# Patient Record
Sex: Female | Born: 1960 | Race: Black or African American | Hispanic: No | Marital: Single | Smoking: Never smoker
Health system: Southern US, Community
[De-identification: ages and names within clinical notes are randomized; demographics above are authoritative.]

## PROBLEM LIST (undated history)

## (undated) DIAGNOSIS — I639 Cerebral infarction, unspecified: Secondary | ICD-10-CM

## (undated) DIAGNOSIS — F419 Anxiety disorder, unspecified: Secondary | ICD-10-CM

## (undated) DIAGNOSIS — I4891 Unspecified atrial fibrillation: Secondary | ICD-10-CM

## (undated) DIAGNOSIS — I10 Essential (primary) hypertension: Secondary | ICD-10-CM

## (undated) DIAGNOSIS — E119 Type 2 diabetes mellitus without complications: Secondary | ICD-10-CM

---

## 2018-10-08 ENCOUNTER — Other Ambulatory Visit: Payer: Self-pay | Admitting: Family Medicine

## 2018-10-08 DIAGNOSIS — R1312 Dysphagia, oropharyngeal phase: Secondary | ICD-10-CM

## 2018-10-10 ENCOUNTER — Inpatient Hospital Stay
Admission: EM | Admit: 2018-10-10 | Discharge: 2018-10-16 | DRG: 872 | Disposition: A | Discharge status: Skilled Nursing Facility | Payer: Medicare Other | Attending: Specialist | Admitting: Specialist

## 2018-10-10 ENCOUNTER — Other Ambulatory Visit: Payer: Self-pay

## 2018-10-10 ENCOUNTER — Emergency Department: Payer: Medicare Other

## 2018-10-10 DIAGNOSIS — I4891 Unspecified atrial fibrillation: Secondary | ICD-10-CM | POA: Diagnosis present

## 2018-10-10 DIAGNOSIS — F329 Major depressive disorder, single episode, unspecified: Secondary | ICD-10-CM | POA: Diagnosis present

## 2018-10-10 DIAGNOSIS — A419 Sepsis, unspecified organism: Secondary | ICD-10-CM | POA: Diagnosis not present

## 2018-10-10 DIAGNOSIS — Z794 Long term (current) use of insulin: Secondary | ICD-10-CM

## 2018-10-10 DIAGNOSIS — E785 Hyperlipidemia, unspecified: Secondary | ICD-10-CM | POA: Diagnosis present

## 2018-10-10 DIAGNOSIS — Z79899 Other long term (current) drug therapy: Secondary | ICD-10-CM

## 2018-10-10 DIAGNOSIS — N390 Urinary tract infection, site not specified: Secondary | ICD-10-CM

## 2018-10-10 DIAGNOSIS — Z7982 Long term (current) use of aspirin: Secondary | ICD-10-CM

## 2018-10-10 DIAGNOSIS — I10 Essential (primary) hypertension: Secondary | ICD-10-CM | POA: Diagnosis present

## 2018-10-10 DIAGNOSIS — I259 Chronic ischemic heart disease, unspecified: Secondary | ICD-10-CM | POA: Diagnosis present

## 2018-10-10 DIAGNOSIS — G4733 Obstructive sleep apnea (adult) (pediatric): Secondary | ICD-10-CM | POA: Diagnosis present

## 2018-10-10 DIAGNOSIS — F419 Anxiety disorder, unspecified: Secondary | ICD-10-CM | POA: Diagnosis present

## 2018-10-10 DIAGNOSIS — R7989 Other specified abnormal findings of blood chemistry: Secondary | ICD-10-CM | POA: Diagnosis not present

## 2018-10-10 DIAGNOSIS — I69320 Aphasia following cerebral infarction: Secondary | ICD-10-CM

## 2018-10-10 DIAGNOSIS — R778 Other specified abnormalities of plasma proteins: Secondary | ICD-10-CM

## 2018-10-10 DIAGNOSIS — Z7901 Long term (current) use of anticoagulants: Secondary | ICD-10-CM

## 2018-10-10 DIAGNOSIS — E119 Type 2 diabetes mellitus without complications: Secondary | ICD-10-CM | POA: Diagnosis present

## 2018-10-10 DIAGNOSIS — T783XXA Angioneurotic edema, initial encounter: Secondary | ICD-10-CM

## 2018-10-10 HISTORY — DX: Unspecified atrial fibrillation: I48.91

## 2018-10-10 HISTORY — DX: Essential (primary) hypertension: I10

## 2018-10-10 HISTORY — DX: Anxiety disorder, unspecified: F41.9

## 2018-10-10 HISTORY — DX: Type 2 diabetes mellitus without complications: E11.9

## 2018-10-10 HISTORY — DX: Cerebral infarction, unspecified: I63.9

## 2018-10-10 MED ORDER — DIPHENHYDRAMINE HCL 50 MG/ML IJ SOLN
INTRAMUSCULAR | Status: AC
  Filled 2018-10-10: qty 1

## 2018-10-10 MED ORDER — METHYLPREDNISOLONE SODIUM SUCC 125 MG IJ SOLR
INTRAMUSCULAR | Status: AC
  Filled 2018-10-10: qty 2

## 2018-10-10 MED ORDER — FAMOTIDINE IN NACL 20-0.9 MG/50ML-% IV SOLN
20.0000 mg | Freq: Once | INTRAVENOUS | Status: AC
  Administered 2018-10-10: 20 mg via INTRAVENOUS
  Filled 2018-10-10: qty 50

## 2018-10-10 MED ORDER — DIPHENHYDRAMINE HCL 50 MG/ML IJ SOLN
25.0000 mg | Freq: Once | INTRAMUSCULAR | Status: AC
  Administered 2018-10-10: 25 mg via INTRAVENOUS

## 2018-10-10 MED ORDER — METHYLPREDNISOLONE SODIUM SUCC 125 MG IJ SOLR
125.0000 mg | Freq: Once | INTRAMUSCULAR | Status: AC
  Administered 2018-10-10: 125 mg via INTRAVENOUS

## 2018-10-10 MED ORDER — SODIUM CHLORIDE 0.9 % IV BOLUS
500.0000 mL | Freq: Once | INTRAVENOUS | Status: AC
  Administered 2018-10-10: 500 mL via INTRAVENOUS

## 2018-10-10 NOTE — ED Triage Notes (Signed)
Pt from [facility]. Pt arrives with multiple family members as well who state pt with lower lip swelling since 1100. Family states "I know you're going to say lisinopril is a possiblity, but that's not it". Pt's family states they are not happy with care being received at [facility]. Pt's family also states that pt with left hand swelling for two days. 

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
## 2018-10-11 ENCOUNTER — Emergency Department: Payer: Medicare Other

## 2018-10-11 ENCOUNTER — Encounter: Payer: Self-pay | Admitting: Internal Medicine

## 2018-10-11 ENCOUNTER — Other Ambulatory Visit: Payer: Self-pay

## 2018-10-11 DIAGNOSIS — I4891 Unspecified atrial fibrillation: Secondary | ICD-10-CM | POA: Diagnosis present

## 2018-10-11 DIAGNOSIS — T783XXA Angioneurotic edema, initial encounter: Secondary | ICD-10-CM | POA: Diagnosis present

## 2018-10-11 DIAGNOSIS — Z7901 Long term (current) use of anticoagulants: Secondary | ICD-10-CM | POA: Diagnosis not present

## 2018-10-11 DIAGNOSIS — R7989 Other specified abnormal findings of blood chemistry: Secondary | ICD-10-CM | POA: Diagnosis present

## 2018-10-11 DIAGNOSIS — N390 Urinary tract infection, site not specified: Secondary | ICD-10-CM | POA: Diagnosis present

## 2018-10-11 DIAGNOSIS — A419 Sepsis, unspecified organism: Secondary | ICD-10-CM | POA: Diagnosis present

## 2018-10-11 DIAGNOSIS — E119 Type 2 diabetes mellitus without complications: Secondary | ICD-10-CM | POA: Diagnosis present

## 2018-10-11 DIAGNOSIS — F419 Anxiety disorder, unspecified: Secondary | ICD-10-CM | POA: Diagnosis present

## 2018-10-11 DIAGNOSIS — E785 Hyperlipidemia, unspecified: Secondary | ICD-10-CM | POA: Diagnosis present

## 2018-10-11 DIAGNOSIS — Z794 Long term (current) use of insulin: Secondary | ICD-10-CM | POA: Diagnosis not present

## 2018-10-11 DIAGNOSIS — Z79899 Other long term (current) drug therapy: Secondary | ICD-10-CM | POA: Diagnosis not present

## 2018-10-11 DIAGNOSIS — Z7982 Long term (current) use of aspirin: Secondary | ICD-10-CM | POA: Diagnosis not present

## 2018-10-11 DIAGNOSIS — I69320 Aphasia following cerebral infarction: Secondary | ICD-10-CM | POA: Diagnosis not present

## 2018-10-11 DIAGNOSIS — F329 Major depressive disorder, single episode, unspecified: Secondary | ICD-10-CM | POA: Diagnosis present

## 2018-10-11 DIAGNOSIS — I10 Essential (primary) hypertension: Secondary | ICD-10-CM | POA: Diagnosis present

## 2018-10-11 DIAGNOSIS — I259 Chronic ischemic heart disease, unspecified: Secondary | ICD-10-CM | POA: Diagnosis present

## 2018-10-11 DIAGNOSIS — G4733 Obstructive sleep apnea (adult) (pediatric): Secondary | ICD-10-CM | POA: Diagnosis present

## 2018-10-11 LAB — CBC WITH DIFFERENTIAL/PLATELET
Abs Immature Granulocytes: 0.01 10*3/uL (ref 0.00–0.07)
Basophils Absolute: 0 10*3/uL (ref 0.0–0.1)
Basophils Relative: 0 %
Eosinophils Absolute: 0.1 10*3/uL (ref 0.0–0.5)
Eosinophils Relative: 1 %
HEMATOCRIT: 37.2 % (ref 36.0–46.0)
Hemoglobin: 11.4 g/dL — ABNORMAL LOW (ref 12.0–15.0)
Immature Granulocytes: 0 %
LYMPHS ABS: 1.6 10*3/uL (ref 0.7–4.0)
Lymphocytes Relative: 25 %
MCH: 26.6 pg (ref 26.0–34.0)
MCHC: 30.6 g/dL (ref 30.0–36.0)
MCV: 86.9 fL (ref 80.0–100.0)
Monocytes Absolute: 0.7 10*3/uL (ref 0.1–1.0)
Monocytes Relative: 11 %
Neutro Abs: 3.9 10*3/uL (ref 1.7–7.7)
Neutrophils Relative %: 63 %
Platelets: 381 10*3/uL (ref 150–400)
RBC: 4.28 MIL/uL (ref 3.87–5.11)
RDW: 14.6 % (ref 11.5–15.5)
WBC: 6.3 10*3/uL (ref 4.0–10.5)
nRBC: 0 % (ref 0.0–0.2)

## 2018-10-11 LAB — COMPREHENSIVE METABOLIC PANEL
ALT: 23 U/L (ref 0–44)
AST: 25 U/L (ref 15–41)
Albumin: 3.5 g/dL (ref 3.5–5.0)
Alkaline Phosphatase: 57 U/L (ref 38–126)
Anion gap: 12 (ref 5–15)
BUN: 27 mg/dL — ABNORMAL HIGH (ref 6–20)
CO2: 27 mmol/L (ref 22–32)
Calcium: 9.5 mg/dL (ref 8.9–10.3)
Chloride: 99 mmol/L (ref 98–111)
Creatinine, Ser: 1.07 mg/dL — ABNORMAL HIGH (ref 0.44–1.00)
GFR calc Af Amer: >60 mL/min (ref >60)
GFR calc non Af Amer: 57 mL/min — ABNORMAL LOW (ref >60)
Glucose, Bld: 267 mg/dL — ABNORMAL HIGH (ref 70–99)
Potassium: 3.8 mmol/L (ref 3.5–5.1)
Sodium: 138 mmol/L (ref 135–145)
Total Bilirubin: 0.9 mg/dL (ref 0.3–1.2)
Total Protein: 7.6 g/dL (ref 6.5–8.1)

## 2018-10-11 LAB — HEMOGLOBIN A1C
Hgb A1c MFr Bld: 8.6 % — ABNORMAL HIGH (ref 4.8–5.6)
Mean Plasma Glucose: 200.12 mg/dL

## 2018-10-11 LAB — URINALYSIS, COMPLETE (UACMP) WITH MICROSCOPIC
Bilirubin Urine: NEGATIVE
Glucose, UA: >=500 mg/dL — AB
Hgb urine dipstick: NEGATIVE
Ketones, ur: 20 mg/dL — AB
Leukocytes, UA: NEGATIVE
Nitrite: NEGATIVE
PROTEIN: 30 mg/dL — AB
Specific Gravity, Urine: 1.018 (ref 1.005–1.030)
pH: 7 (ref 5.0–8.0)

## 2018-10-11 LAB — MRSA PCR SCREENING: MRSA by PCR: NEGATIVE

## 2018-10-11 LAB — LACTIC ACID, PLASMA: Lactic Acid, Venous: 1.1 mmol/L (ref 0.5–1.9)

## 2018-10-11 LAB — GLUCOSE, CAPILLARY
Glucose-Capillary: 306 mg/dL — ABNORMAL HIGH (ref 70–99)
Glucose-Capillary: 303 mg/dL — ABNORMAL HIGH (ref 70–99)
Glucose-Capillary: 261 mg/dL — ABNORMAL HIGH (ref 70–99)
Glucose-Capillary: 239 mg/dL — ABNORMAL HIGH (ref 70–99)

## 2018-10-11 LAB — CG4 I-STAT (LACTIC ACID): Lactic Acid, Venous: 2.55 mmol/L (ref 0.5–1.9)

## 2018-10-11 LAB — TROPONIN I: Troponin I: 0.04 ng/mL (ref <0.03)

## 2018-10-11 LAB — TSH: TSH: 0.793 u[IU]/mL (ref 0.350–4.500)

## 2018-10-11 MED ORDER — SACCHAROMYCES BOULARDII 250 MG PO CAPS
250.0000 mg | ORAL_CAPSULE | Freq: Every day | ORAL | Status: DC
  Administered 2018-10-11 – 2018-10-16 (×6): 250 mg
  Filled 2018-10-11 (×6): qty 1

## 2018-10-11 MED ORDER — ASPIRIN 81 MG PO CHEW
81.0000 mg | CHEWABLE_TABLET | Freq: Every day | ORAL | Status: DC
  Administered 2018-10-11 – 2018-10-16 (×6): 81 mg
  Filled 2018-10-11 (×6): qty 1

## 2018-10-11 MED ORDER — SODIUM CHLORIDE 0.9 % IV BOLUS (SEPSIS)
1000.0000 mL | Freq: Once | INTRAVENOUS | Status: AC
  Administered 2018-10-11: 1000 mL via INTRAVENOUS

## 2018-10-11 MED ORDER — ACETAMINOPHEN 160 MG/5ML PO SOLN
650.0000 mg | ORAL | Status: DC | PRN
  Administered 2018-10-11 – 2018-10-16 (×6): 650 mg
  Filled 2018-10-11 (×8): qty 20.3

## 2018-10-11 MED ORDER — FERROUS SULFATE 220 (44 FE) MG/5ML PO ELIX
300.0000 mg | ORAL_SOLUTION | Freq: Every day | ORAL | Status: DC
  Administered 2018-10-11 – 2018-10-16 (×6): 300 mg via ORAL
  Filled 2018-10-11 (×7): qty 6.9

## 2018-10-11 MED ORDER — LISINOPRIL 20 MG PO TABS
20.0000 mg | ORAL_TABLET | Freq: Every day | ORAL | Status: DC
  Administered 2018-10-11 – 2018-10-13 (×3): 20 mg
  Filled 2018-10-11 (×3): qty 1

## 2018-10-11 MED ORDER — INSULIN GLARGINE 100 UNIT/ML ~~LOC~~ SOLN
24.0000 [IU] | Freq: Every day | SUBCUTANEOUS | Status: DC
  Administered 2018-10-11 – 2018-10-15 (×5): 24 [IU] via SUBCUTANEOUS
  Filled 2018-10-11 (×6): qty 0.24

## 2018-10-11 MED ORDER — FREE WATER
180.0000 mL | Freq: Three times a day (TID) | Status: DC
  Administered 2018-10-11 – 2018-10-16 (×20): 180 mL

## 2018-10-11 MED ORDER — VITAMIN B-12 1000 MCG PO TABS
1000.0000 ug | ORAL_TABLET | Freq: Every day | ORAL | Status: DC
  Administered 2018-10-11 – 2018-10-16 (×6): 1000 ug
  Filled 2018-10-11 (×6): qty 1

## 2018-10-11 MED ORDER — SODIUM CHLORIDE 0.9 % IV SOLN
INTRAVENOUS | Status: DC
  Administered 2018-10-11 – 2018-10-14 (×3): via INTRAVENOUS

## 2018-10-11 MED ORDER — INSULIN ASPART 100 UNIT/ML ~~LOC~~ SOLN
0.0000 [IU] | Freq: Three times a day (TID) | SUBCUTANEOUS | Status: DC
  Administered 2018-10-11: 12:00:00 100 [IU] via SUBCUTANEOUS
  Administered 2018-10-11: 17:00:00 5 [IU] via SUBCUTANEOUS
  Administered 2018-10-11: 08:00:00 7 [IU] via SUBCUTANEOUS
  Administered 2018-10-12: 2 [IU] via SUBCUTANEOUS
  Administered 2018-10-12: 3 [IU] via SUBCUTANEOUS
  Filled 2018-10-11 (×5): qty 1

## 2018-10-11 MED ORDER — ALPRAZOLAM 0.25 MG PO TABS
0.2500 mg | ORAL_TABLET | Freq: Once | ORAL | Status: AC
  Administered 2018-10-11: 0.25 mg
  Filled 2018-10-11: qty 1

## 2018-10-11 MED ORDER — EPINEPHRINE 0.3 MG/0.3ML IJ SOAJ
0.3000 mg | Freq: Once | INTRAMUSCULAR | Status: AC
  Administered 2018-10-11: 0.3 mg via INTRAMUSCULAR
  Filled 2018-10-11: qty 0.3

## 2018-10-11 MED ORDER — ORAL CARE MOUTH RINSE
15.0000 mL | Freq: Two times a day (BID) | OROMUCOSAL | Status: DC
  Administered 2018-10-11 – 2018-10-16 (×11): 15 mL via OROMUCOSAL

## 2018-10-11 MED ORDER — FERROUS SULFATE 300 (60 FE) MG/5ML PO SYRP
300.0000 mg | ORAL_SOLUTION | Freq: Every day | ORAL | Status: DC
  Filled 2018-10-11: qty 5

## 2018-10-11 MED ORDER — SODIUM CHLORIDE 0.9 % IV BOLUS (SEPSIS)
500.0000 mL | Freq: Once | INTRAVENOUS | Status: AC
  Administered 2018-10-11: 05:00:00 500 mL via INTRAVENOUS

## 2018-10-11 MED ORDER — MELATONIN 5 MG PO TABS
5.0000 mg | ORAL_TABLET | Freq: Every day | ORAL | Status: DC
  Administered 2018-10-11 – 2018-10-15 (×5): 5 mg
  Filled 2018-10-11 (×6): qty 1

## 2018-10-11 MED ORDER — ATROPINE SULFATE 1 % OP SOLN
1.0000 [drp] | Freq: Three times a day (TID) | OPHTHALMIC | Status: DC | PRN
  Filled 2018-10-11: qty 2

## 2018-10-11 MED ORDER — SODIUM CHLORIDE 0.9 % IV SOLN
2.0000 g | Freq: Once | INTRAVENOUS | Status: AC
  Administered 2018-10-11: 2 g via INTRAVENOUS
  Filled 2018-10-11: qty 2

## 2018-10-11 MED ORDER — SODIUM CHLORIDE 0.9 % IV SOLN
1.0000 g | Freq: Two times a day (BID) | INTRAVENOUS | Status: DC
  Administered 2018-10-11: 1 g via INTRAVENOUS
  Filled 2018-10-11 (×2): qty 1

## 2018-10-11 MED ORDER — LISINOPRIL 20 MG PO TABS: 20.0000 mg | ORAL_TABLET | Freq: Every day | ORAL | Status: DC

## 2018-10-11 MED ORDER — FLUOXETINE HCL 20 MG/5ML PO SOLN
40.0000 mg | Freq: Every day | ORAL | Status: DC
  Administered 2018-10-11 – 2018-10-16 (×6): 40 mg
  Filled 2018-10-11 (×7): qty 10

## 2018-10-11 MED ORDER — ADULT MULTIVITAMIN W/MINERALS CH
1.0000 | ORAL_TABLET | Freq: Every day | ORAL | Status: DC
  Administered 2018-10-11 – 2018-10-16 (×6): 1
  Filled 2018-10-11 (×6): qty 1

## 2018-10-11 MED ORDER — GLUCERNA 1.5 CAL PO LIQD
237.0000 mL | Freq: Three times a day (TID) | ORAL | Status: DC
  Administered 2018-10-11 – 2018-10-16 (×20): 237 mL

## 2018-10-11 MED ORDER — SODIUM CHLORIDE 0.9 % IV SOLN
1.0000 g | INTRAVENOUS | Status: DC
  Administered 2018-10-11 – 2018-10-12 (×2): 1 g via INTRAVENOUS
  Filled 2018-10-11: qty 10
  Filled 2018-10-11: qty 1
  Filled 2018-10-11: qty 10

## 2018-10-11 MED ORDER — VITAMIN C 500 MG PO TABS
250.0000 mg | ORAL_TABLET | Freq: Two times a day (BID) | ORAL | Status: DC
  Administered 2018-10-11 – 2018-10-16 (×11): 250 mg
  Filled 2018-10-11 (×11): qty 1

## 2018-10-11 MED ORDER — DILTIAZEM HCL 30 MG PO TABS
60.0000 mg | ORAL_TABLET | Freq: Three times a day (TID) | ORAL | Status: DC
  Administered 2018-10-11 – 2018-10-16 (×15): 60 mg
  Filled 2018-10-11 (×15): qty 2

## 2018-10-11 MED ORDER — HYDRALAZINE HCL 25 MG PO TABS
25.0000 mg | ORAL_TABLET | Freq: Four times a day (QID) | ORAL | Status: DC | PRN
  Administered 2018-10-13 – 2018-10-14 (×3): 25 mg
  Filled 2018-10-11 (×4): qty 1

## 2018-10-11 MED ORDER — DOCUSATE SODIUM 100 MG PO CAPS
100.0000 mg | ORAL_CAPSULE | Freq: Two times a day (BID) | ORAL | Status: DC
  Administered 2018-10-11 – 2018-10-15 (×6): 100 mg via ORAL
  Filled 2018-10-11 (×8): qty 1

## 2018-10-11 MED ORDER — BENZOCAINE 10 % MT GEL
1.0000 "application " | Freq: Three times a day (TID) | OROMUCOSAL | Status: DC | PRN
  Administered 2018-10-11 – 2018-10-14 (×4): 1 via OROMUCOSAL
  Filled 2018-10-11: qty 9

## 2018-10-11 MED ORDER — FREE WATER: 180.0000 mL | Freq: Three times a day (TID) | Status: DC

## 2018-10-11 MED ORDER — PRO-STAT SUGAR FREE PO LIQD
30.0000 mL | Freq: Every day | ORAL | Status: DC
  Administered 2018-10-11 – 2018-10-16 (×6): 30 mL

## 2018-10-11 MED ORDER — ONDANSETRON HCL 4 MG/2ML IJ SOLN
4.0000 mg | Freq: Four times a day (QID) | INTRAMUSCULAR | Status: DC | PRN
  Administered 2018-10-13: 4 mg via INTRAVENOUS
  Filled 2018-10-11: qty 2

## 2018-10-11 MED ORDER — ONDANSETRON HCL 4 MG PO TABS: 4.0000 mg | ORAL_TABLET | Freq: Four times a day (QID) | ORAL | Status: DC | PRN

## 2018-10-11 MED ORDER — GLUCERNA 1.5 CAL PO LIQD: 237.0000 mL | Freq: Three times a day (TID) | ORAL | Status: DC

## 2018-10-11 MED ORDER — FLUTICASONE PROPIONATE 50 MCG/ACT NA SUSP
2.0000 | Freq: Two times a day (BID) | NASAL | Status: DC
  Administered 2018-10-11 – 2018-10-16 (×11): 2 via NASAL
  Filled 2018-10-11: qty 16

## 2018-10-11 MED ORDER — GLUCAGON HCL RDNA (DIAGNOSTIC) 1 MG IJ SOLR: 1.0000 mg | Freq: Every day | INTRAMUSCULAR | Status: DC | PRN

## 2018-10-11 MED ORDER — APIXABAN 5 MG PO TABS
5.0000 mg | ORAL_TABLET | Freq: Two times a day (BID) | ORAL | Status: DC
  Administered 2018-10-11 – 2018-10-16 (×11): 5 mg
  Filled 2018-10-11 (×11): qty 1

## 2018-10-11 MED ORDER — ATROPINE SULFATE 1 % OP SOLN
1.0000 [drp] | Freq: Three times a day (TID) | OPHTHALMIC | Status: DC | PRN
  Administered 2018-10-11 – 2018-10-16 (×7): 1 [drp] via SUBLINGUAL
  Filled 2018-10-11 (×2): qty 2

## 2018-10-11 MED ORDER — POLYETHYLENE GLYCOL 3350 17 G PO PACK
17.0000 g | PACK | Freq: Every day | ORAL | Status: DC | PRN
  Administered 2018-10-12: 17 g
  Filled 2018-10-11: qty 1

## 2018-10-11 MED ORDER — ATORVASTATIN CALCIUM 80 MG PO TABS
80.0000 mg | ORAL_TABLET | Freq: Every day | ORAL | Status: DC
  Administered 2018-10-11 – 2018-10-15 (×5): 80 mg
  Filled 2018-10-11: qty 1
  Filled 2018-10-11 (×2): qty 4
  Filled 2018-10-11 (×5): qty 1

## 2018-10-11 NOTE — H&P (Signed)
Summer Welch is an 57 y.o. female.   Chief Complaint: Oral swelling HPI: The patient with past medical history of stroke presents to the emergency department from [facility] due to mouth swelling.  Patient's family reports that the swelling began approximately 12 hours prior to arrival.  It is unclear what the patient may be allergic to.  She has been taking lisinopril but the family is adamant that on previous occasions the lip swelling was not deemed to be secondary to ACE inhibitor's.  The patient is nonverbal but indicates that she is not in pain.  She also denies shortness of breath (via head movements yes or no questions).  She was given epinephrine for her angioedema.  She was also noted to eat sepsis criteria and have a strong odor of urine which prompted the emergency department staff to obtain blood and urine cultures prior to starting antibiotics and fluid resuscitation.  The patient remained stable throughout the ED evaluation and was eventually admitted to the hospitalist service.  Past Medical History:  Diagnosis Date  . Anxiety   . Stroke [facility])     History reviewed. No pertinent surgical history.  History reviewed. No pertinent family history. Social History:  reports that she has never smoked. She has never used smokeless tobacco. She reports that she does not drink alcohol or use drugs.  Allergies: No Known Allergies  Medications Prior to Admission  Medication Sig Dispense Refill  . Amino Acids-Protein Hydrolys (FEEDING SUPPLEMENT, PRO-STAT SUGAR FREE 64,) LIQD Place 30 mLs into feeding tube daily.    Marland Kitchen apixaban (ELIQUIS) 5 MG TABS tablet Place 5 mg into feeding tube 2 (two) times daily.    Marland Kitchen aspirin 81 MG chewable tablet Place 81 mg into feeding tube daily.    Marland Kitchen atorvastatin (LIPITOR) 80 MG tablet Place 80 mg into feeding tube daily at 6 PM.    . diltiazem (CARDIZEM) 60 MG tablet Place 60 mg into feeding tube 3 (three) times daily.    . ferrous sulfate 300 (60 Fe)  MG/5ML syrup Place 300 mg into feeding tube daily.    Marland Kitchen FLUoxetine (PROZAC) 20 MG/5ML solution Place 40 mg into feeding tube daily.    . fluticasone (FLONASE) 50 MCG/ACT nasal spray Place 2 sprays into both nostrils every 12 (twelve) hours.    . insulin glargine (LANTUS) 100 UNIT/ML injection Inject 40 Units into the skin daily.    . insulin lispro (HUMALOG) 100 UNIT/ML injection Inject 0-10 Units into the skin every 6 (six) hours. If blood sugar is less than 60, call MD 201-250= 2 units 251-300= 4 units 301-350= 6 units 351-400= 8 units 401-450= 10 units If blood sugar is greater than 450, call MD.    . insulin lispro (HUMALOG) 100 UNIT/ML injection Inject 13 Units into the skin every 6 (six) hours.    Marland Kitchen lisinopril (PRINIVIL,ZESTRIL) 20 MG tablet Place 20 mg into feeding tube daily.    . Melatonin 5 MG TABS Place 5 mg into feeding tube at bedtime.    . Multiple Vitamin (MULTIVITAMIN WITH MINERALS) TABS tablet Place 1 tablet into feeding tube daily.    Marland Kitchen saccharomyces boulardii (FLORASTOR) 250 MG capsule Place 250 mg into feeding tube daily.    . vitamin B-12 (CYANOCOBALAMIN) 1000 MCG tablet Place 1,000 mcg into feeding tube daily.    . vitamin C (ASCORBIC ACID) 250 MG tablet Place 250 mg into feeding tube 2 (two) times daily.    Marland Kitchen acetaminophen (TYLENOL) 160 MG/5ML solution Place  650 mg into feeding tube every 4 (four) hours as needed (pain).    Marland Kitchen atropine 1 % ophthalmic solution Place 1 drop into both eyes 3 (three) times daily as needed (secretions).    . BENZOCAINE, DENTAL, (ANBESOL MAXIMUM STRENGTH) 20 % LIQD 1 application by Mouth Rinse route 3 (three) times daily as needed (pain). Apply to teeth    . glucagon (GLUCAGEN) 1 MG SOLR injection Inject 1 mg into the muscle daily as needed for low blood sugar.    . hydrALAZINE (APRESOLINE) 25 MG tablet Place 25 mg into feeding tube every 6 (six) hours as needed (SBP >165 or DBP >95).    . ondansetron (ZOFRAN) 4 MG tablet Place 4 mg into  feeding tube 4 (four) times daily as needed for nausea or vomiting.    . polyethylene glycol (MIRALAX / GLYCOLAX) packet Place 17 g into feeding tube daily as needed for mild constipation.      Results for orders placed or performed during the hospital encounter of 10/10/18 (from the past 48 hour(s))  CBC with Differential     Status: Abnormal   Collection Time: 10/10/18 11:39 PM  Result Value Ref Range   WBC 6.3 4.0 - 10.5 K/uL   RBC 4.28 3.87 - 5.11 MIL/uL   Hemoglobin 11.4 (L) 12.0 - 15.0 g/dL   HCT 16.1 09.6 - 04.5 %   MCV 86.9 80.0 - 100.0 fL   MCH 26.6 26.0 - 34.0 pg   MCHC 30.6 30.0 - 36.0 g/dL   RDW 40.9 81.1 - 91.4 %   Platelets 381 150 - 400 K/uL   nRBC 0.0 0.0 - 0.2 %   Neutrophils Relative % 63 %   Neutro Abs 3.9 1.7 - 7.7 K/uL   Lymphocytes Relative 25 %   Lymphs Abs 1.6 0.7 - 4.0 K/uL   Monocytes Relative 11 %   Monocytes Absolute 0.7 0.1 - 1.0 K/uL   Eosinophils Relative 1 %   Eosinophils Absolute 0.1 0.0 - 0.5 K/uL   Basophils Relative 0 %   Basophils Absolute 0.0 0.0 - 0.1 K/uL   Immature Granulocytes 0 %   Abs Immature Granulocytes 0.01 0.00 - 0.07 K/uL    Comment: Performed at [facility], [address] [place], [place] [address]  Comprehensive metabolic panel     Status: Abnormal   Collection Time: 10/10/18 11:39 PM  Result Value Ref Range   Sodium 138 135 - 145 mmol/L   Potassium 3.8 3.5 - 5.1 mmol/L   Chloride 99 98 - 111 mmol/L   CO2 27 22 - 32 mmol/L   Glucose, Bld 267 (H) 70 - 99 mg/dL   BUN 27 (H) 6 - 20 mg/dL   Creatinine, Ser 6.21 (H) 0.44 - 1.00 mg/dL   Calcium 9.5 8.9 - 30.8 mg/dL   Total Protein 7.6 6.5 - 8.1 g/dL   Albumin 3.5 3.5 - 5.0 g/dL   AST 25 15 - 41 U/L   ALT 23 0 - 44 U/L   Alkaline Phosphatase 57 38 - 126 U/L   Total Bilirubin 0.9 0.3 - 1.2 mg/dL   GFR calc non Af Amer 57 (L) >60 mL/min   GFR calc Af Amer >60 >60 mL/min   Anion gap 12 5 - 15    Comment: Performed at [facility], [address],  [place], [place] [address]  Troponin I - Once     Status: Abnormal   Collection Time: 10/10/18 11:39 PM  Result Value  Ref Range   Troponin I 0.04 (HH) <0.03 ng/mL    Comment: CRITICAL RESULT CALLED TO, READ BACK BY AND VERIFIED WITH APRIL BRUMGARD ON 10/11/18 AT 0036 [facility] Performed at [facility], [address] [place], [place] [address]   Urinalysis, Complete w Microscopic     Status: Abnormal   Collection Time: 10/10/18 11:39 PM  Result Value Ref Range   Color, Urine AMBER (A) YELLOW    Comment: BIOCHEMICALS MAY BE AFFECTED BY COLOR   APPearance CLOUDY (A) CLEAR   Specific Gravity, Urine 1.018 1.005 - 1.030   pH 7.0 5.0 - 8.0   Glucose, UA >=500 (A) NEGATIVE mg/dL   Hgb urine dipstick NEGATIVE NEGATIVE   Bilirubin Urine NEGATIVE NEGATIVE   Ketones, ur 20 (A) NEGATIVE mg/dL   Protein, ur 30 (A) NEGATIVE mg/dL   Nitrite NEGATIVE NEGATIVE   Leukocytes, UA NEGATIVE NEGATIVE   RBC / HPF 11-20 0 - 5 RBC/hpf   WBC, UA 0-5 0 - 5 WBC/hpf   Bacteria, UA MANY (A) NONE SEEN   Squamous Epithelial / LPF 0-5 0 - 5   Amorphous Crystal PRESENT     Comment: Performed at [facility], [address] [place], [place] [address]  TSH     Status: None   Collection Time: 10/10/18 11:39 PM  Result Value Ref Range   TSH 0.793 0.350 - 4.500 uIU/mL    Comment: Performed by a 3rd Generation assay with a functional sensitivity of <=0.01 uIU/mL. Performed at [facility], [address] [place], [place] [address]   CG4 I-STAT (Lactic acid)     Status: Abnormal   Collection Time: 10/11/18 12:40 AM  Result Value Ref Range   Lactic Acid, Venous 2.55 (HH) 0.5 - 1.9 mmol/L   Comment NOTIFIED PHYSICIAN   Blood Culture (routine x 2)     Status: None (Preliminary result)   Collection Time: 10/11/18  2:12 AM  Result Value Ref Range   Specimen Description BLOOD RIGHT FOREARM    Special Requests      BOTTLES DRAWN AEROBIC AND ANAEROBIC Blood Culture results may not be optimal due  to an inadequate volume of blood received in culture bottles   Culture      NO GROWTH < 12 HOURS Performed at [facility], [address] [place], [place] [address]    Report Status PENDING   Blood Culture (routine x 2)     Status: None (Preliminary result)   Collection Time: 10/11/18  2:12 AM  Result Value Ref Range   Specimen Description BLOOD RIGHT HAND    Special Requests      BOTTLES DRAWN AEROBIC AND ANAEROBIC Blood Culture results may not be optimal due to an inadequate volume of blood received in culture bottles   Culture      NO GROWTH < 12 HOURS Performed at [facility], [address] [place], [place] [address]    Report Status PENDING   MRSA PCR Screening     Status: None   Collection Time: 10/11/18  5:14 AM  Result Value Ref Range   MRSA by PCR NEGATIVE NEGATIVE    Comment:        The GeneXpert MRSA Assay (FDA approved for NASAL specimens only), is one component of a comprehensive MRSA colonization surveillance program. It is not intended to diagnose MRSA infection nor to guide or monitor treatment for MRSA infections. Performed at [facility], [address] [place], [place] [address]  Lactic acid, plasma     Status: None   Collection Time: 10/11/18  6:19 AM  Result Value Ref Range   Lactic Acid, Venous 1.1 0.5 - 1.9 mmol/L    Comment: Performed at [facility], [address] [place], [place] [address]   Dg Chest Port 1 View  Result Date: 10/10/2018 CLINICAL DATA:  Lower lip swelling EXAM: PORTABLE CHEST 1 VIEW COMPARISON:  None. FINDINGS: The heart size and mediastinal contours are within normal limits. Both lungs are clear. The visualized skeletal structures are unremarkable. IMPRESSION: No active cardiopulmonary disease. Electronically Signed   By: Sherian ReinWei-Chen  Lin M.D.   On: 10/10/2018 23:52   Dg Hand Complete Left  Result Date: 10/11/2018 CLINICAL DATA:  Left hand swelling EXAM: LEFT HAND - COMPLETE 3+  VIEW COMPARISON:  None. FINDINGS: There is no evidence of fracture or dislocation. There is no evidence of arthropathy or other focal bone abnormality. Soft tissues are unremarkable. IMPRESSION: No acute osseous abnormality of the left hand. Electronically Signed   By: Deatra RobinsonKevin  Herman M.D.   On: 10/11/2018 01:42    Review of Systems  Unable to perform ROS: Patient nonverbal    Blood pressure (!) 155/79, pulse (!) 102, temperature 98.3 F (36.8 C), temperature source Oral, resp. rate 20, height 5\' 9"  (1.753 m), weight 82.6 kg, SpO2 97 %. Physical Exam  Vitals reviewed. Constitutional: She is oriented to person, place, and time. She appears well-developed and well-nourished. No distress.  HENT:  Head: Normocephalic and atraumatic.  Mouth/Throat: Oropharynx is clear and moist.    Eyes: Pupils are equal, round, and reactive to light. Conjunctivae and EOM are normal. No scleral icterus.  Neck: Normal range of motion. Neck supple. No JVD present. No tracheal deviation present. No thyromegaly present.  Cardiovascular: Normal rate, regular rhythm and normal heart sounds. Exam reveals no gallop and no friction rub.  No murmur heard. Respiratory: Effort normal and breath sounds normal.  GI: Soft. Bowel sounds are normal. She exhibits no distension. There is no abdominal tenderness.  Musculoskeletal:        General: Edema (Left upper extremity) present.  Lymphadenopathy:    She has no cervical adenopathy.  Neurological: She is alert and oriented to person, place, and time. No cranial nerve deficit. She exhibits normal muscle tone.  Left side hemiparesis  Skin: Skin is warm and dry.  Psychiatric:  Difficult to assess as the patient is not verbal     Assessment/Plan This is a 57 year old female admitted for sepsis. 1.  Sepsis: The patient meets criteria via tachycardia and tachypnea.  She is hemodynamically stable.  Urine is likely source.  Continue cefepime.  Follow blood and urine cultures for  growth and sensitivities. 2.  UTI: Present on admission.  Antibiotics as above until sensitivities allow narrowing of spectrum. 3.  Angioedema: Improving; monitor respiratory status.  I have discontinued lisinopril. 4.  Diabetes mellitus type 2: Continue basal insulin therapy as well as sliding scale insulin while hospitalized. 5.  History of stroke: Presumably secondary to A. fib.  Continue Eliquis and diltiazem.  Continue aspirin.  Secondary prevention with statin therapy, blood sugar management and blood pressure control. 6.  DVT prophylaxis: Therapeutic anticoagulation 7.  GI prophylaxis: None The patient is a full code.  Time spent on admission orders and patient care approximately 45 minutes  Arnaldo Nataliamond,  Michael S, MD 10/11/2018, 7:13 AM

## 2018-10-11 NOTE — Progress Notes (Signed)
Initial Nutrition Assessment  DOCUMENTATION CODES:   Not applicable  INTERVENTION:  Initiate Glucerna 1.5 Cal bolus 1 can QID per G-tube. Also provide Pro-Stat 30 mL once daily per G-tube. Provides 1524 kcal, 93 grams of protein, 15.2 grams of fiber, 720 mL H2O daily.  Flush tube with 60 mL before each can of tube feeding and 120 mL after each can of tube feeding. Flush tube with 60 mL prior to providing Pro-Stat, mix Pro-Stat with 60 mL of water before giving per tube, and flush with another 60 mL water after Pro-Stat.  Continue MVI daily per tube, vitamin B12 1000 micrograms daily per tube, and vitamin C 250 mg BID per tube.  NUTRITION DIAGNOSIS:   Inadequate oral intake related to inability to eat, dysphagia as evidenced by NPO status(reliance on G-tube to meet 100% calorie/protein/hydration needs).  GOAL:   Patient will meet greater than or equal to 90% of their needs  MONITOR:   Labs, Weight trends, TF tolerance, Skin, I & O's  REASON FOR ASSESSMENT:   Malnutrition Screening Tool, Consult Assessment of nutrition requirement/status, Enteral/tube feeding initiation and management  ASSESSMENT:   57 year old female with PMHx of anxiety, hx CVA, DM type 2 who is admitted from [facility] with angioedema, sepsis, UTI.   Met with patient at bedside. She is nonverbal and no family members present at time of RD assessment. There was no paperwork sent from [facility] with patient so was unable to see her usual tube feed orders. Called and spoke with RN that cares for patient at [facility]. She reports patient was getting "too much" tube feeding so it was recently decreased. She is now on Glucerna 1.5 Cal one can bolus QID (6am, noon, 6pm, midnight) with a free water flush of 300 mL after each bolus regimen. She is unsure if she is still getting the Pro-Stat 30 mL daily per tube. Abdomen is soft. Patient has an 18 Fr. G-tube in place (no records available on when this was  placed). Wasting in lower extremities found on NFPE likely related to muscle atrophy from non-ambulatory status.  No access to any weight history. Patient is currently 82.6 kg (182 lbs) if current weight in chart is accurate.  Medications reviewed and include: Eliquis, Colace 100 mg BID, Pro-Stat 30 mL daily, ferrous sulfate 300 mg daily, Novolog 0-9 units TID, Lantus 24 units QHS, MVI daily per tube, Florastor 250 mg daily per tube, vitamin B12 1000 mcg daily per tube, vitamin C 240 mg BID per tube, cefepime.  Labs reviewed: CBG 306. On 12/28, BUN 27, Creatinine 1.07.  Patient does not meet criteria for malnutrition.  Discussed with RN.  NUTRITION - FOCUSED PHYSICAL EXAM:    Most Recent Value  Orbital Region  No depletion  Upper Arm Region  No depletion  Thoracic and Lumbar Region  No depletion  Buccal Region  No depletion  Temple Region  No depletion  Clavicle Bone Region  No depletion  Clavicle and Acromion Bone Region  No depletion  Scapular Bone Region  No depletion  Dorsal Hand  No depletion  Patellar Region  Moderate depletion  Anterior Thigh Region  Moderate depletion  Posterior Calf Region  Severe depletion  Edema (RD Assessment)  None  Hair  Reviewed  Eyes  Reviewed  Mouth  Unable to assess  Skin  Reviewed  Nails  Reviewed     Diet Order:   Diet Order    None     EDUCATION NEEDS:  No education needs have been identified at this time  Skin:  Skin Assessment: Reviewed RN Assessment(scattered ecchymosis)  Last BM:  10/11/2018 per chart  Height:   Ht Readings from Last 1 Encounters:  10/10/18 '5\' 9"'$  (1.753 m)   Weight:   Wt Readings from Last 1 Encounters:  10/10/18 82.6 kg   Ideal Body Weight:  65.9 kg  BMI:  Body mass index is 26.88 kg/m.  Estimated Nutritional Needs:   Kcal:  5072-2575 (MSJ x 1.1-1.3)  Protein:  80-100 grams (1-1.2 grams/kg)  Fluid:  1.6-1.9 L/day (25-30 mL/kg IBW)  Willey Blade, MS, RD, LDN Office:  [phone] Pager: [phone] After Hours/Weekend Pager: [phone]

## 2018-10-11 NOTE — ED Notes (Signed)
Pt repositioned in bed for comfort.

## 2018-10-11 NOTE — Consult Note (Signed)
HPI: Patient was admitted to the [facility] last night secondary to lip swelling. Family brought patient to ED [facility] and acute change in lip. They had been with her the night before and she looked fine. They came in the next morning (Sat AM) and noted some bleeding from corner of mouth and lip swelling. They then came to [place] Saturday night; they desire a change in facility. There is no history of this happening before. They deny respiratory difficulty, stridor. There is associated left wrist swelling and discomfort, per nursing work-up of wrist swelling has been negative. Subjective history is very difficult to obtain from patient as she is aphasic 2/t stroke.  Past Medical History:  Diagnosis Date  . Anxiety   . Stroke [facility](HCC)    No current facility-administered medications on file prior to encounter.    Current Outpatient Medications on File Prior to Encounter  Medication Sig Dispense Refill  . Amino Acids-Protein Hydrolys (FEEDING SUPPLEMENT, PRO-STAT SUGAR FREE 64,) LIQD Place 30 mLs into feeding tube daily.    Marland Kitchen. apixaban (ELIQUIS) 5 MG TABS tablet Place 5 mg into feeding tube 2 (two) times daily.    Marland Kitchen. aspirin 81 MG chewable tablet Place 81 mg into feeding tube daily.    Marland Kitchen. atorvastatin (LIPITOR) 80 MG tablet Place 80 mg into feeding tube daily at 6 PM.    . diltiazem (CARDIZEM) 60 MG tablet Place 60 mg into feeding tube 3 (three) times daily.    . ferrous sulfate 300 (60 Fe) MG/5ML syrup Place 300 mg into feeding tube daily.    Marland Kitchen. FLUoxetine (PROZAC) 20 MG/5ML solution Place 40 mg into feeding tube daily.    . fluticasone (FLONASE) 50 MCG/ACT nasal spray Place 2 sprays into both nostrils every 12 (twelve) hours.    . insulin glargine (LANTUS) 100 UNIT/ML injection Inject 40 Units into the skin daily.    . insulin lispro (HUMALOG) 100 UNIT/ML injection Inject 0-10 Units into the skin every 6 (six) hours. If blood sugar is less than 60, call MD 201-250= 2  units 251-300= 4 units 301-350= 6 units 351-400= 8 units 401-450= 10 units If blood sugar is greater than 450, call MD.    . insulin lispro (HUMALOG) 100 UNIT/ML injection Inject 13 Units into the skin every 6 (six) hours.    Marland Kitchen. lisinopril (PRINIVIL,ZESTRIL) 20 MG tablet Place 20 mg into feeding tube daily.    . Melatonin 5 MG TABS Place 5 mg into feeding tube at bedtime.    . Multiple Vitamin (MULTIVITAMIN WITH MINERALS) TABS tablet Place 1 tablet into feeding tube daily.    Marland Kitchen. saccharomyces boulardii (FLORASTOR) 250 MG capsule Place 250 mg into feeding tube daily.    . vitamin B-12 (CYANOCOBALAMIN) 1000 MCG tablet Place 1,000 mcg into feeding tube daily.    . vitamin C (ASCORBIC ACID) 250 MG tablet Place 250 mg into feeding tube 2 (two) times daily.    Marland Kitchen. acetaminophen (TYLENOL) 160 MG/5ML solution Place 650 mg into feeding tube every 4 (four) hours as needed (pain).    Marland Kitchen. atropine 1 % ophthalmic solution Place 1 drop into both eyes 3 (three) times daily as needed (secretions).    . BENZOCAINE, DENTAL, (ANBESOL MAXIMUM STRENGTH) 20 % LIQD 1 application by Mouth Rinse route 3 (three) times daily as needed (pain). Apply to teeth    . glucagon (GLUCAGEN) 1 MG SOLR injection Inject 1 mg into the muscle daily as needed for low blood sugar.    .Marland Kitchen  hydrALAZINE (APRESOLINE) 25 MG tablet Place 25 mg into feeding tube every 6 (six) hours as needed (SBP >165 or DBP >95).    . ondansetron (ZOFRAN) 4 MG tablet Place 4 mg into feeding tube 4 (four) times daily as needed for nausea or vomiting.    . polyethylene glycol (MIRALAX / GLYCOLAX) packet Place 17 g into feeding tube daily as needed for mild constipation.    No Known Allergies   History reviewed. No pertinent family history.   There is no recent lip surgery.  Today's Vitals   10/11/18 0315 10/11/18 0421 10/11/18 0955 10/11/18 0958  BP:  (!) 155/79 (!) 117/101 (!) 155/64  Pulse: (!) 111 (!) 102 (!) 113 (!) 107  Resp:  20 20 20   Temp:  98.3 F  (36.8 C) 99.3 F (37.4 C)   TempSrc:  Oral Oral   SpO2: 99% 97% 96% 95%  Weight:      Height:       Body mass index is 26.88 kg/m.   WBC- 6  Exam Gen- NAD Resp- NWOB, no stridor, no wheezing OC/OP- Swelling of left lower lip with some echymoses along vermilion border. The lip mucosa has overlying white exudate, but no purulence. It is slightly indurated, but I do not appreciate any discrete underlying mass. Teeth are in poor condition but no obvious dental trauma. Tongue, palate, buccal mucosa, and posterior oropharyx wnl Neuro-  Facial function grossly intact and symmetric Skin- no cutaneous lesions on face Neck- no lymphadenopathy or masses, parotid/thyroid normal to palpation  A/P- 57 yo with acute onset lip swelling and mucosal change occurring Friday night. I discussed differential including infectious, neoplastic, and traumatic etiologies with patient and sister. I favor traumatic cause given exam findings/appearance of mucosa, timing of events, and bleeding noted by family coincident with onset of swelling. I do not feel this warrants biopsy at present time; this is also complicated by fact that patient is anticoagulated.  I recommend conservative measures including oral/dental hygiene and ice packs as needed. This is not compromising her airway at this time, exam findings are not consistent with angioedema. I would expect improvement in next 48-72 hours, if lesion/mucosal change begins to expand/worsen I'd be happy to re-evaluate.

## 2018-10-11 NOTE — ED Notes (Signed)
Transporting patient to room 110.

## 2018-10-11 NOTE — ED Notes (Signed)
Explanation of admission plan provided to pt and family.

## 2018-10-11 NOTE — Progress Notes (Signed)
Low grade fever. Noted large ulcerated like area on inner left lower lip with large amount edema which is tender to touch. Oral care done. ENT consult done and relates to probable trauma. IV antibiotics continued with IVF's. Gtube feedings resumed and tolerated well. Urine is malodorous. Family in at bedside.

## 2018-10-11 NOTE — Progress Notes (Signed)
Chaplain responded to an OR for an AD. Pt is not cognizant to understand AD.    10/11/18 0900  Clinical Encounter Type  Visited With Patient  Visit Type Initial  Referral From Nurse  Spiritual Encounters  Spiritual Needs Brochure

## 2018-10-11 NOTE — ED Notes (Signed)
Explanation of treatment plan provided to pt and family.

## 2018-10-11 NOTE — Progress Notes (Signed)
Pharmacy Antibiotic Note  [name] is a 57 y.o. female admitted on 10/10/2018 with UTI.  Pharmacy has been consulted for Cefepime dosing.  Plan: Cefepime 1g IV q12h  Height: 5\' 9"  (175.3 cm) Weight: 182 lb (82.6 kg) IBW/kg (Calculated) : 66.2  Temp (24hrs), Avg:98.2 F (36.8 C), Min:98.2 F (36.8 C), Max:98.2 F (36.8 C)  Recent Labs  Lab 10/10/18 2339 10/11/18 0040  WBC 6.3  --   CREATININE 1.07*  --   LATICACIDVEN  --  2.55*    Estimated Creatinine Clearance: 65.9 mL/min (A) (by C-G formula based on SCr of 1.07 mg/dL (H)).    No Known Allergies  Antimicrobials this admission: Cefepime 12/29 >>   Microbiology results: 12/29 BCx: pending 12/29 UCx: pending   Thank you for allowing pharmacy to be a part of this patient's care.  Clovia CuffLisa Conna Terada, PharmD, BCPS 10/11/2018 3:28 AM

## 2018-10-11 NOTE — Progress Notes (Signed)
CODE SEPSIS - PHARMACY COMMUNICATION  **Broad Spectrum Antibiotics should be administered within 1 hour of Sepsis diagnosis**  Time Code Sepsis Called/Page Received: 01:01  Antibiotics Ordered: Cefepime  Time of 1st antibiotic administration: 01:56  Additional action taken by pharmacy: called RN 01:50  If necessary, Name of Provider/Nurse Contacted:      Foye DeerLisa G Byrdie Miyazaki ,PharmD Clinical Pharmacist  10/11/2018  2:01 AM

## 2018-10-11 NOTE — ED Notes (Signed)
No change noted to lip. Pt cleansed of bowel movement, suction provided for clear secretions in mouth. Head of bed elevated to 45 degrees, legs repositioned with pillows and bed for comfort.

## 2018-10-11 NOTE — ED Notes (Signed)
In to check on pt; pt rubbing right thigh; unable to verbalize needs but shakes head when asked if having pain; assisted pt with positioning pillow under leg for comfort; has relaxed now; IV site to right hand unremarkable; fluids infusing without difficulty; clear secretions suctioned from inside cheeks; pt tolerated well

## 2018-10-11 NOTE — ED Notes (Signed)
Note both sets of blood cultures collected at this time, prior to antibiotic administration.

## 2018-10-11 NOTE — ED Notes (Signed)
Critical troponin of 0.04 called from lab. Dr. Sung notified.  

## 2018-10-11 NOTE — Progress Notes (Signed)
[place] at [place] NAME: Summer Welch    MR#:  [id number]  DATE OF BIRTH:  Apr 09, 1961  SUBJECTIVE:   Patient admitted to the hospital this morning due to lip swelling and suspected sepsis and possible angioedema.  Patient denies any shortness of breath.  She is nonverbal and aphasic due to her underlying stroke.  REVIEW OF SYSTEMS:    Review of Systems  Unable to perform ROS: Mental acuity    Nutrition: Tube feedings Tolerating Diet: yes Tolerating PT: Await Eval.   DRUG ALLERGIES:  No Known Allergies  VITALS:  Blood pressure (!) 155/64, pulse (!) 107, temperature 99.3 F (37.4 C), temperature source Oral, resp. rate 20, height _0  (1.753 m), weight 82.6 kg, SpO2 95 %.  PHYSICAL EXAMINATION:   Physical Exam  GENERAL:  57 y.o.-year-old patient lying in bed in no acute distress.  EYES: Pupils equal, round, reactive to light and accommodation. No scleral icterus. Extraocular muscles intact.  HEENT: Head atraumatic, normocephalic. Oropharynx and nasopharynx clear.  Lower lip lesion as shown below   NECK:  Supple, no jugular venous distention. No thyroid enlargement, no tenderness LUNGS: Normal breath sounds bilaterally, no wheezing, rales, rhonchi. No use of accessory muscles of respiration.  CARDIOVASCULAR: S1, S2 normal. No murmurs, rubs, or gallops.  ABDOMEN: Soft, nontender, nondistended. Bowel sounds present. No organomegaly or mass.  EXTREMITIES: No cyanosis, clubbing or edema b/l.    NEUROLOGIC: Cranial nerves II through XII are intact. No focal Motor or sensory deficits b/l. Globally weak and aphasic.    PSYCHIATRIC: The patient is alert and oriented x 3.  SKIN: No obvious rash, lesion, or ulcer.    LABORATORY PANEL:   CBC Recent Labs  Lab 10/10/18 2339  WBC 6.3  HGB 11.4*  HCT 37.2  PLT 381    ------------------------------------------------------------------------------------------------------------------  Chemistries  Recent Labs  Lab 10/10/18 2339  NA 138  K 3.8  CL 99  CO2 27  GLUCOSE 267*  BUN 27*  CREATININE 1.07*  CALCIUM 9.5  AST 25  ALT 23  ALKPHOS 57  BILITOT 0.9   ------------------------------------------------------------------------------------------------------------------  Cardiac Enzymes Recent Labs  Lab 10/10/18 2339  TROPONINI 0.04*   ------------------------------------------------------------------------------------------------------------------  RADIOLOGY:  Dg Chest Port 1 View  Result Date: 10/10/2018 CLINICAL DATA:  Lower lip swelling EXAM: PORTABLE CHEST 1 VIEW COMPARISON:  None. FINDINGS: The heart size and mediastinal contours are within normal limits. Both lungs are clear. The visualized skeletal structures are unremarkable. IMPRESSION: No active cardiopulmonary disease. Electronically Signed   By: Abelardo Diesel M.D.   On: 10/10/2018 23:52   Dg Hand Complete Left  Result Date: 10/11/2018 CLINICAL DATA:  Left hand swelling EXAM: LEFT HAND - COMPLETE 3+ VIEW COMPARISON:  None. FINDINGS: There is no evidence of fracture or dislocation. There is no evidence of arthropathy or other focal bone abnormality. Soft tissues are unremarkable. IMPRESSION: No acute osseous abnormality of the left hand. Electronically Signed   By: Ulyses Jarred M.D.   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  1.  Suspected sepsis- patient met criteria on admission given her tachycardia, tachypnea and urinalysis consistent with a UTI. - Continue IV ceftriaxone, follow cultures.  Patient is currently afebrile and  hemodynamically  stable.  2.  Urinary tract infection- in the ER patient given broad-spectrum IV antibiotics but will narrow to just IV ceftriaxone for now.  Follow urine cultures.  3.  Lower lip swelling/angioedema- this was suspected on admission.  I do not think the patient has angioedema secondary to ACE inhibitors.  She has localized left lip swelling.  No airway compromise or hemodynamic instability. - We will get ENT to further evaluate the lower lip lesion.  4.  Diabetes type 2 without complication- continue sliding scale insulin, follow blood sugars.    5.  History of atrial fibrillation-rate controlled.  Continue Cardizem. -Continue Eliquis.  6.  History of previous CVA with resulting aphasia- continue Eliquis.  7.  Essential hypertension-continue Cardizem, will resume lisinopril.  8.  Hyperlipidemia-continue atorvastatin.  69.  Nutrition-given patient's previous CVA she is currently on tube feeds.  Dietary consulted and will resume tube feedings as per them.  10.  Depression-continue Prozac.   All the records are reviewed and case discussed with Care Management/Social Worker. Management plans discussed with the patient, family and they are in agreement.  CODE STATUS: Full code  DVT Prophylaxis: Eliquis  TOTAL TIME TAKING CARE OF THIS PATIENT: 30 minutes.   POSSIBLE D/C IN 2-3 DAYS, DEPENDING ON CLINICAL CONDITION.   Henreitta Leber M.D on 10/11/2018 at 1:47 PM  Between 7am to 6pm - Pager - [phone]  After 6pm go to [email or web address] - [facility]  Office  [phone]  CC: Primary care physician; Center, [facility]

## 2018-10-12 LAB — BASIC METABOLIC PANEL
Anion gap: 6 (ref 5–15)
BUN: 30 mg/dL — ABNORMAL HIGH (ref 6–20)
CALCIUM: 8.7 mg/dL — AB (ref 8.9–10.3)
CO2: 25 mmol/L (ref 22–32)
Chloride: 109 mmol/L (ref 98–111)
Creatinine, Ser: 0.96 mg/dL (ref 0.44–1.00)
GFR calc Af Amer: >60 mL/min (ref >60)
GFR calc non Af Amer: >60 mL/min (ref >60)
Glucose, Bld: 238 mg/dL — ABNORMAL HIGH (ref 70–99)
Potassium: 4 mmol/L (ref 3.5–5.1)
Sodium: 140 mmol/L (ref 135–145)

## 2018-10-12 LAB — GLUCOSE, CAPILLARY
Glucose-Capillary: 181 mg/dL — ABNORMAL HIGH (ref 70–99)
Glucose-Capillary: 202 mg/dL — ABNORMAL HIGH (ref 70–99)
Glucose-Capillary: 127 mg/dL — ABNORMAL HIGH (ref 70–99)
Glucose-Capillary: 149 mg/dL — ABNORMAL HIGH (ref 70–99)

## 2018-10-12 LAB — CBC
HCT: 30.7 % — ABNORMAL LOW (ref 36.0–46.0)
Hemoglobin: 9.3 g/dL — ABNORMAL LOW (ref 12.0–15.0)
MCH: 26.6 pg (ref 26.0–34.0)
MCHC: 30.3 g/dL (ref 30.0–36.0)
MCV: 88 fL (ref 80.0–100.0)
Platelets: 305 10*3/uL (ref 150–400)
RBC: 3.49 MIL/uL — ABNORMAL LOW (ref 3.87–5.11)
RDW: 14.8 % (ref 11.5–15.5)
WBC: 12.4 10*3/uL — AB (ref 4.0–10.5)
nRBC: 0 % (ref 0.0–0.2)

## 2018-10-12 MED ORDER — INSULIN ASPART 100 UNIT/ML ~~LOC~~ SOLN
0.0000 [IU] | Freq: Every day | SUBCUTANEOUS | Status: DC
  Administered 2018-10-13: 23:00:00 2 [IU] via SUBCUTANEOUS
  Filled 2018-10-12: qty 1

## 2018-10-12 MED ORDER — INSULIN ASPART 100 UNIT/ML ~~LOC~~ SOLN
0.0000 [IU] | Freq: Three times a day (TID) | SUBCUTANEOUS | Status: DC
  Administered 2018-10-13: 2 [IU] via SUBCUTANEOUS
  Administered 2018-10-13: 5 [IU] via SUBCUTANEOUS
  Administered 2018-10-13: 2 [IU] via SUBCUTANEOUS
  Administered 2018-10-14: 3 [IU] via SUBCUTANEOUS
  Administered 2018-10-14: 5 [IU] via SUBCUTANEOUS
  Administered 2018-10-14 – 2018-10-15 (×2): 3 [IU] via SUBCUTANEOUS
  Administered 2018-10-15: 2 [IU] via SUBCUTANEOUS
  Administered 2018-10-15: 3 [IU] via SUBCUTANEOUS
  Administered 2018-10-16: 08:00:00 2 [IU] via SUBCUTANEOUS
  Administered 2018-10-16: 12:00:00 3 [IU] via SUBCUTANEOUS
  Filled 2018-10-12 (×11): qty 1

## 2018-10-12 MED ORDER — INSULIN ASPART 100 UNIT/ML ~~LOC~~ SOLN
4.0000 [IU] | Freq: Four times a day (QID) | SUBCUTANEOUS | Status: DC
  Administered 2018-10-12 – 2018-10-16 (×16): 4 [IU] via SUBCUTANEOUS
  Filled 2018-10-12 (×16): qty 1

## 2018-10-12 NOTE — Progress Notes (Signed)
[place] at [place] NAME: Summer Welch    MR#:  [id number]  DATE OF BIRTH:  06-Jan-1961  SUBJECTIVE:   No acute events overnight, swelling of her left side of her lower lip has improved.  Seen by ENT and they do not think this is angioedema but likely traumatic ulcer with some swelling.  REVIEW OF SYSTEMS:    Review of Systems  Unable to perform ROS: Mental acuity    Nutrition: Tube feedings Tolerating Diet: yes Tolerating PT: bedbound at baseline   DRUG ALLERGIES:  No Known Allergies  VITALS:  Blood pressure 102/65, pulse 97, temperature 97.6 F (36.4 C), temperature source Oral, resp. rate 20, height _0  (1.753 m), weight 89.1 kg, SpO2 95 %.  PHYSICAL EXAMINATION:   Physical Exam  GENERAL:  57 y.o.-year-old patient lying in bed in no acute distress.  EYES: Pupils equal, round, reactive to light and accommodation. No scleral icterus. Extraocular muscles intact.  HEENT: Head atraumatic, normocephalic. Oropharynx and nasopharynx clear.  Lower lip lesion as shown below   NECK:  Supple, no jugular venous distention. No thyroid enlargement, no tenderness LUNGS: Normal breath sounds bilaterally, no wheezing, rales, rhonchi. No use of accessory muscles of respiration.  CARDIOVASCULAR: S1, S2 normal. No murmurs, rubs, or gallops.  ABDOMEN: Soft, nontender, nondistended. Bowel sounds present. No organomegaly or mass.  EXTREMITIES: No cyanosis, clubbing or edema b/l.    NEUROLOGIC: Cranial nerves II through XII are intact. No focal Motor or sensory deficits b/l. Globally weak and aphasic.    PSYCHIATRIC: The patient is alert and oriented x 1  SKIN: No obvious rash, lesion, or ulcer.    LABORATORY PANEL:   CBC Recent Labs  Lab 10/12/18 0419  WBC 12.4*  HGB 9.3*  HCT 30.7*  PLT 305   ------------------------------------------------------------------------------------------------------------------  Chemistries  Recent  Labs  Lab 10/10/18 2339 10/12/18 0419  NA 138 140  K 3.8 4.0  CL 99 109  CO2 27 25  GLUCOSE 267* 238*  BUN 27* 30*  CREATININE 1.07* 0.96  CALCIUM 9.5 8.7*  AST 25  --   ALT 23  --   ALKPHOS 57  --   BILITOT 0.9  --    ------------------------------------------------------------------------------------------------------------------  Cardiac Enzymes Recent Labs  Lab 10/10/18 2339  TROPONINI 0.04*   ------------------------------------------------------------------------------------------------------------------  RADIOLOGY:  Dg Chest Port 1 View  Result Date: 10/10/2018 CLINICAL DATA:  Lower lip swelling EXAM: PORTABLE CHEST 1 VIEW COMPARISON:  None. FINDINGS: The heart size and mediastinal contours are within normal limits. Both lungs are clear. The visualized skeletal structures are unremarkable. IMPRESSION: No active cardiopulmonary disease. Electronically Signed   By: Abelardo Diesel M.D.   On: 10/10/2018 23:52   Dg Hand Complete Left  Result Date: 10/11/2018 CLINICAL DATA:  Left hand swelling EXAM: LEFT HAND - COMPLETE 3+ VIEW COMPARISON:  None. FINDINGS: There is no evidence of fracture or dislocation. There is no evidence of arthropathy or other focal bone abnormality. Soft tissues are unremarkable. IMPRESSION: No acute osseous abnormality of the left hand. Electronically Signed   By: Ulyses Jarred M.D.   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  1.  Suspected sepsis- patient met criteria on admission given her  tachycardia, tachypnea and urinalysis consistent with a UTI. - Continue IV ceftriaxone, and cultures are positive for Proteus but sensitivities pending.  Afebrile and hemodynamically stable.  2.  Urinary tract infection-  continue IV ceftriaxone, urine cultures positive for Proteus but sensitivities pending.  3.  Lower lip swelling- unlikely this is angioedema as patient swelling is localized to the left lateral lip.  Did get a ENT consult yesterday who do not think this is angioedema but likely traumatic ulcer with some swelling.  Continue local care as per ENT. -Swelling looks better today.  4.  Diabetes type 2 without complication- patient had diabetes coordinator input.  Patient's blood sugars are somewhat labile.  Continue Lantus, will continue sliding scale insulin with bedtime coverage, also add some NovoLog 4 times daily with the patient's Glucerna shakes..    5.  History of atrial fibrillation-rate controlled.  Continue Cardizem. -Continue Eliquis.  6.  History of previous CVA with resulting aphasia- continue Eliquis.  7.  Essential hypertension-continue Cardizem, lisinopril.   8.  Hyperlipidemia-continue atorvastatin.  9.  Nutrition- cont. Tube feeds and pt. Is tolerating them with no residuals.   10.  Depression-continue Prozac.   Pt's family does not want the patient going back to [facility] and would prefer another facility. Social work aware and pending input from them.    All the records are reviewed and case discussed with Care Management/Social Worker. Management plans discussed with the patient, family and they are in agreement.  CODE STATUS: Full code  DVT Prophylaxis: Eliquis  TOTAL TIME TAKING CARE OF THIS PATIENT: 30 minutes.   POSSIBLE D/C IN 1-2 DAYS, DEPENDING ON CLINICAL CONDITION.   Henreitta Leber M.D on 10/12/2018 at 1:15 PM  Between 7am to 6pm - Pager - [phone]  After 6pm go to [email or web address] - [facility]  Office  [phone]  CC: Primary care physician; Center, [facility]

## 2018-10-12 NOTE — NC FL2 (Addendum)
[place] MEDICAID FL2 LEVEL OF CARE SCREENING TOOL     IDENTIFICATION  Patient Name: Summer Welch Birthdate: 08/24/1961 Sex: female Admission Date (Current Location): 10/10/2018  [place]   Facility and Address:  [facility], [address], [place], [place] [address]      Provider Number: [id number]  Attending Physician Name and Address:  Houston SirenSainani, Vivek J, MD  Relative Name and Phone Number:       Current Level of Care: Hospital Recommended Level of Care: Skilled Nursing Facility Prior Approval Number:    Date Approved/Denied:   PASRR Number: [id number]  Discharge Plan: SNF    Current Diagnoses: Patient Active Problem List   Diagnosis Date Noted  . Sepsis (HCC) 10/11/2018    Orientation RESPIRATION BLADDER Height & Weight     Self  Normal Incontinent Weight: 196 lb 6.9 oz (89.1 kg) Height:  5\' 9"  (175.3 cm)  BEHAVIORAL SYMPTOMS/MOOD NEUROLOGICAL BOWEL NUTRITION STATUS  (none) (none) Incontinent Diet  AMBULATORY STATUS COMMUNICATION OF NEEDS Skin   Extensive Assist Verbally Normal                       Personal Care Assistance Level of Assistance  Bathing, Feeding, Dressing Bathing Assistance: Maximum assistance Feeding assistance: Limited assistance Dressing Assistance: Maximum assistance     Functional Limitations Info  Sight, Hearing, Speech Sight Info: Adequate Hearing Info: Adequate Speech Info: Adequate    SPECIAL CARE FACTORS FREQUENCY                      Contractures Contractures Info: Not present    Additional Factors Info  Code Status, Allergies Code Status Info: Full Code  Allergies Info: NKA           Current Medications (10/12/2018):  This is the current hospital active medication list Current Facility-Administered Medications  Medication Dose Route Frequency Provider Last Rate Last Dose  . 0.9 %  sodium chloride infusion   Intravenous Continuous  Arnaldo Nataliamond, Michael S, MD 75 mL/hr at 10/11/18 1923    . acetaminophen (TYLENOL) solution 650 mg  650 mg Per Tube Q4H PRN Arnaldo Nataliamond, Michael S, MD   650 mg at 10/11/18 2108  . apixaban (ELIQUIS) tablet 5 mg  5 mg Per Tube BID Arnaldo Nataliamond, Michael S, MD   5 mg at 10/12/18 [id number]  . aspirin chewable tablet 81 mg  81 mg Per Tube Daily Arnaldo Nataliamond, Michael S, MD   81 mg at 10/12/18 [id number]  . atorvastatin (LIPITOR) tablet 80 mg  80 mg Per Tube q1800 Arnaldo Nataliamond, Michael S, MD   80 mg at 10/11/18 1645  . atropine 1 % ophthalmic solution 1 drop  1 drop Sublingual TID PRN Arnaldo Nataliamond, Michael S, MD   1 drop at 10/12/18 0902  . benzocaine (ORAJEL) 10 % mucosal gel 1 application  1 application Mouth/Throat TID PRN Arnaldo Nataliamond, Michael S, MD   1 application at 10/12/18 [phone] [id number]  . cefTRIAXone (ROCEPHIN) 1 g in sodium chloride 0.9 % 100 mL IVPB  1 g Intravenous Q24H Houston SirenSainani, Vivek J, MD 200 mL/hr at 10/11/18 2113 1 g at 10/11/18 2113  . diltiazem (CARDIZEM) tablet 60 mg  60 mg Per Tube TID Arnaldo Nataliamond, Michael S, MD   60 mg at 10/12/18 0906  . docusate sodium (COLACE) capsule 100 mg  100 mg Oral BID Arnaldo Nataliamond, Michael S, MD   100 mg at 10/11/18 2106  . feeding supplement (GLUCERNA 1.5 CAL)  liquid 237 mL  237 mL Per Tube TID WC & HS Houston SirenSainani, Vivek J, MD   237 mL at 10/12/18 1302  . feeding supplement (PRO-STAT SUGAR FREE 64) liquid 30 mL  30 mL Per Tube Daily Houston SirenSainani, Vivek J, MD   30 mL at 10/12/18 1302  . ferrous sulfate 220 (44 Fe) MG/5ML solution 300 mg  300 mg Oral Daily Houston SirenSainani, Vivek J, MD   300 mg at 10/12/18 0905  . FLUoxetine (PROZAC) 20 MG/5ML solution 40 mg  40 mg Per Tube Daily Arnaldo Nataliamond, Michael S, MD   40 mg at 10/12/18 [id number]  . fluticasone (FLONASE) 50 MCG/ACT nasal spray 2 spray  2 spray Each Nare Q12H Arnaldo Nataliamond, Michael S, MD   2 spray at 10/12/18 0901  . free water 180 mL  180 mL Per Tube TID WC & HS Houston SirenSainani, Vivek J, MD   180 mL at 10/12/18 1316  . glucagon (human recombinant) (GLUCAGEN) injection 1 mg  1 mg Intramuscular Daily PRN  Arnaldo Nataliamond, Michael S, MD      . hydrALAZINE (APRESOLINE) tablet 25 mg  25 mg Per Tube Q6H PRN Arnaldo Nataliamond, Michael S, MD      . insulin aspart (novoLOG) injection 0-5 Units  0-5 Units Subcutaneous QHS Sainani, Vivek J, MD      . insulin aspart (novoLOG) injection 0-9 Units  0-9 Units Subcutaneous TID WC Sainani, Vivek J, MD      . insulin aspart (novoLOG) injection 4 Units  4 Units Subcutaneous QID Houston SirenSainani, Vivek J, MD      . insulin glargine (LANTUS) injection 24 Units  24 Units Subcutaneous QHS Arnaldo Nataliamond, Michael S, MD   24 Units at 10/11/18 2121  . lisinopril (PRINIVIL,ZESTRIL) tablet 20 mg  20 mg Per Tube Daily Houston SirenSainani, Vivek J, MD   20 mg at 10/12/18 0906  . MEDLINE mouth rinse  15 mL Mouth Rinse q12n4p Houston SirenSainani, Vivek J, MD   15 mL at 10/12/18 1317  . Melatonin TABS 5 mg  5 mg Per Tube QHS Arnaldo Nataliamond, Michael S, MD   5 mg at 10/11/18 2108  . multivitamin with minerals tablet 1 tablet  1 tablet Per Tube Daily Arnaldo Nataliamond, Michael S, MD   1 tablet at 10/12/18 [id number]  . ondansetron (ZOFRAN) tablet 4 mg  4 mg Oral Q6H PRN Arnaldo Nataliamond, Michael S, MD       Or  . ondansetron [facility](ZOFRAN) injection 4 mg  4 mg Intravenous Q6H PRN Arnaldo Nataliamond, Michael S, MD      . polyethylene glycol (MIRALAX / GLYCOLAX) packet 17 g  17 g Per Tube Daily PRN Arnaldo Nataliamond, Michael S, MD   17 g at 10/12/18 0908  . saccharomyces boulardii (FLORASTOR) capsule 250 mg  250 mg Per Tube Daily Arnaldo Nataliamond, Michael S, MD   250 mg at 10/12/18 [id number]  . vitamin B-12 (CYANOCOBALAMIN) tablet 1,000 mcg  1,000 mcg Per Tube Daily Arnaldo Nataliamond, Michael S, MD   1,000 mcg at 10/12/18 [id number]  . vitamin C (ASCORBIC ACID) tablet 250 mg  250 mg Per Tube BID Arnaldo Nataliamond, Michael S, MD   250 mg at 10/12/18 [id number]     Discharge Medications: Please see discharge summary for a list of discharge medications.  Relevant Imaging Results:  Relevant Lab Results:   Additional Information SSN: [id number]  Ruthe MannanCandace  Letti [place], [place]

## 2018-10-12 NOTE — Clinical Social Work Note (Signed)
Clinical Social Work Assessment  Patient Details  Name: Summer Welch MRN: [id number] Date of Birth: [birth year]  Date of referral:  10/12/18               Reason for consult:  Facility Placement                Permission sought to share information with:  Case Manager, Customer service manager, Family Supports Permission granted to share information::  Yes, Verbal Permission Granted  Name::      SNF  Agency::   [facility]   Relationship::     Contact Information:     Housing/Transportation Living arrangements for the past 2 months:  [place] of Information:  Siblings Patient Interpreter Needed:  Other (Comment Required)(Patient is nonverbal ) Criminal Activity/Legal Involvement Pertinent to Current Situation/Hospitalization:  No - Comment as needed Significant Relationships:  Other Family Members, Siblings Lives with:  Facility Resident Do you feel safe going back to the place where you live?  Yes Need for family participation in patient care:  Yes (Comment)  Care giving concerns:  Patient is from [facility]    Social Worker assessment / plan:  CSW consulted for SNF placement. CSW met patient and sister Michael Boston at bedside. Patient is non verbal but is alert. Sister states that patient has been at [facility] for about 2 months and they are looking for long term placement at other facilities. Sister also states that her daughter is the patient's POA. CSW spoke with patient's POA Donley Redder [phone]. POA reports that she has been working on finding long term placement for patient and that they do not want patient returning to [facility]. POA states that they would like patient to go to [facility] in [place]. CSW explained that if [facility] is unable to accept patient that she will have to return to [facility] until they could find other placement. CSW will follow for discharge planning.   Employment status:  Disabled (Comment on whether  or not currently receiving Disability) Insurance information:  [place] PT Recommendations:  Not assessed at this time Information / Referral to community resources:  [place]  Patient/Family's Response to care:  Sister and Niece thanked CSW for assistance   Patient/Family's Understanding of and Emotional Response to Diagnosis, Current Treatment, and Prognosis:  Family understands that patient needs long term care.   Emotional Assessment Appearance:  Appears stated age Attitude/Demeanor/Rapport:    Affect (typically observed):  Pleasant Orientation:  Oriented to Self, Oriented to Place Alcohol / Substance use:  Not Applicable Psych involvement (Current and /or in the community):  No (Comment)  Discharge Needs  Concerns to be addressed:  Discharge Planning Concerns Readmission within the last 30 days:  No Current discharge risk:  None Barriers to Discharge:  Continued Medical Work up   [employer], [place] 10/12/2018, 1:52 PM

## 2018-10-12 NOTE — Progress Notes (Addendum)
Inpatient Diabetes Program Recommendations  AACE/ADA: New Consensus Statement on Inpatient Glycemic Control (2019)  Target Ranges:  Prepandial:   less than 140 mg/dL      [facility] postprandial:   less than 180 mg/dL (1-2 hours)      Critically ill patients:  140 - 180 mg/dL   Results for Summer Welch, Summer Welch (MRN [id number]) as of 10/12/2018 08:52  Ref. Range 10/11/2018 07:53 10/11/2018 11:40 10/11/2018 16:35 10/11/2018 20:49 10/12/2018 08:13  Glucose-Capillary Latest Ref Range: 70 - 99 mg/dL 409306 (H) 811303 (H) 914261 (H) 239 (H) 181 (H)   Review of Glycemic Control  Diabetes history: DM2 Outpatient Diabetes medications: Lantus 40 units daily, Humalog 13 units Q6H, Humalog 0-10 units Q6H Current orders for Inpatient glycemic control: Lantus 24 units QHS, Novolog 0-9 units TID with meals; Glucerna 237 ml AC&HS  Inpatient Diabetes Program Recommendations:  Correction (SSI): Please consider ordering Novolog 0-5 units QHS for bedtime correction. Insulin -Tube Feeding Coverage: Since patient is receiving tube feeding, please consider ordering Novolog 4 units QID (scheduled with same timing as Glucerna - 8am, 12pm, 5 pm, 10pm).  Thanks, Summer PennerMarie Nakyah Erdmann, RN, MSN, CDE Diabetes Coordinator Inpatient Diabetes Program [phone] (Team Pager from 8am to 5pm)

## 2018-10-13 LAB — URINE CULTURE: Culture: >=100000 — AB

## 2018-10-13 LAB — GLUCOSE, CAPILLARY
Glucose-Capillary: 166 mg/dL — ABNORMAL HIGH (ref 70–99)
Glucose-Capillary: 278 mg/dL — ABNORMAL HIGH (ref 70–99)
Glucose-Capillary: 200 mg/dL — ABNORMAL HIGH (ref 70–99)
Glucose-Capillary: 216 mg/dL — ABNORMAL HIGH (ref 70–99)

## 2018-10-13 MED ORDER — CEPHALEXIN 250 MG/5ML PO SUSR
500.0000 mg | Freq: Two times a day (BID) | ORAL | Status: AC
  Administered 2018-10-13 – 2018-10-15 (×6): 500 mg
  Filled 2018-10-13 (×6): qty 10

## 2018-10-13 MED ORDER — LISINOPRIL 20 MG PO TABS
40.0000 mg | ORAL_TABLET | Freq: Every day | ORAL | Status: DC
  Administered 2018-10-14 – 2018-10-16 (×3): 40 mg
  Filled 2018-10-13 (×3): qty 2

## 2018-10-13 NOTE — Clinical Social Work Note (Signed)
CSW spoke with Teacher, English as a foreign languagehawn at [facility] [phone]. He states that they can accept patient to a long term bed on Thursday. CSW notified patient's sister Heloise BeechamBrenda Swann at bedside of bed availability and CSW notified patient's niece Einar PheasantMechi Holt of bed availability as well. Family accepted bed offer. CSW notified Shawn of bed acceptance and will continue to follow for discharge planning.   Ruthe Mannanandace Junita Kubota MSW, [address] [phone]

## 2018-10-13 NOTE — Progress Notes (Signed)
[place] at [place] NAME: Summer Welch    MR#:  [id number]  DATE OF BIRTH:  04-13-1961  SUBJECTIVE:   No acute events overnight.  Lower lip swelling has improved. Afebrile, hemodynamically stable.   REVIEW OF SYSTEMS:    Review of Systems  Unable to perform ROS: Mental acuity    Nutrition: Tube feedings Tolerating Diet: yes Tolerating PT: bedbound at baseline   DRUG ALLERGIES:  No Known Allergies  VITALS:  Blood pressure (!) 182/96, pulse (!) 105, temperature 98.6 F (37 C), temperature source Axillary, resp. rate 18, height _0  (1.753 m), weight 89.1 kg, SpO2 96 %.  PHYSICAL EXAMINATION:   Physical Exam  GENERAL:  57 y.o.-year-old patient lying in bed in no acute distress.  EYES: Pupils equal, round, reactive to light and accommodation. No scleral icterus. Extraocular muscles intact.  HEENT: Head atraumatic, normocephalic. Oropharynx and nasopharynx clear.  Lower lip lesion as shown below   NECK:  Supple, no jugular venous distention. No thyroid enlargement, no tenderness LUNGS: Normal breath sounds bilaterally, no wheezing, rales, rhonchi. No use of accessory muscles of respiration.  CARDIOVASCULAR: S1, S2 normal. No murmurs, rubs, or gallops.  ABDOMEN: Soft, nontender, nondistended. Bowel sounds present. No organomegaly or mass.  EXTREMITIES: No cyanosis, clubbing or edema b/l.    NEUROLOGIC: Cranial nerves II through XII are intact. No focal Motor or sensory deficits b/l. Globally weak and aphasic.    PSYCHIATRIC: The patient is alert and oriented x 1.  SKIN: No obvious rash, lesion, or ulcer.    LABORATORY PANEL:   CBC Recent Labs  Lab 10/12/18 0419  WBC 12.4*  HGB 9.3*  HCT 30.7*  PLT 305   ------------------------------------------------------------------------------------------------------------------  Chemistries  Recent Labs  Lab 10/10/18 2339 10/12/18 0419  NA 138 140  K 3.8 4.0  CL 99  109  CO2 27 25  GLUCOSE 267* 238*  BUN 27* 30*  CREATININE 1.07* 0.96  CALCIUM 9.5 8.7*  AST 25  --   ALT 23  --   ALKPHOS 57  --   BILITOT 0.9  --    ------------------------------------------------------------------------------------------------------------------  Cardiac Enzymes Recent Labs  Lab 10/10/18 2339  TROPONINI 0.04*   ------------------------------------------------------------------------------------------------------------------  RADIOLOGY:  No results found.   ASSESSMENT AND PLAN:   57 year old female with past medical history of previous CVA, anxiety, aphasia secondary to recent CVA, essential hypertension, diabetes, depression who presents to the hospital due to swelling of her left side of her lower lip.  1.  Suspected sepsis- patient met criteria on admission given her tachycardia, tachypnea and urinalysis consistent with a UTI. Urine cultures are positive for Proteus and it is fairly sensitive.  DC IV ceftriaxone and switched over to oral Keflex suspension.  Patient is now afebrile and hemodynamically stable.  2.  Urinary tract infection- cultures are positive for Proteus which is fairly sensitive. - We will switch from IV ceftriaxone to oral Keflex suspension.  Treat for additional 3 days.  3.  Lower lip swelling- unlikely this is angioedema as patient swelling is localized to the left lateral lip.  Did get a ENT consult who do not think this is angioedema but likely traumatic ulcer with some swelling.  Continue local care as per ENT. - cont. Orajel for pain.   4.  Diabetes type 2 without complication- appreciate DM coordinator input.   -  Continue Lantus, SSI, Novolog with Glucerna shakes. BS improved since yesterday.   5.  History of atrial fibrillation-rate controlled.  Continue Cardizem. -Continue Eliquis.  6.  History of previous CVA with resulting aphasia- continue Eliquis.  7.  Essential hypertension-continue Cardizem, lisinopril.   8.   Hyperlipidemia-continue atorvastatin.  9.  Nutrition- cont. Tube feeds and pt. Is tolerating them with no residuals.   10.  Depression-continue Prozac.  Pt's family does not want the patient going back to [facility] and would prefer another facility. Social work aware and pending input from them.    All the records are reviewed and case discussed with Care Management/Social Worker. Management plans discussed with the patient, family and they are in agreement.  CODE STATUS: Full code  DVT Prophylaxis: Eliquis  TOTAL TIME TAKING CARE OF THIS PATIENT: 30 minutes.   POSSIBLE D/C IN 1-2 DAYS, DEPENDING ON CLINICAL CONDITION.   Henreitta Leber M.D on 10/13/2018 at 2:16 PM  Between 7am to 6pm - Pager - [phone]  After 6pm go to [email or web address] - [employer] [facility]  Office  [phone]  CC: Primary care physician; Center, [facility]

## 2018-10-14 LAB — BASIC METABOLIC PANEL
ANION GAP: 8 (ref 5–15)
BUN: 18 mg/dL (ref 6–20)
CO2: 25 mmol/L (ref 22–32)
Calcium: 8.6 mg/dL — ABNORMAL LOW (ref 8.9–10.3)
Chloride: 102 mmol/L (ref 98–111)
Creatinine, Ser: 0.78 mg/dL (ref 0.44–1.00)
GFR calc non Af Amer: >60 mL/min (ref >60)
Glucose, Bld: 222 mg/dL — ABNORMAL HIGH (ref 70–99)
Potassium: 3.5 mmol/L (ref 3.5–5.1)
Sodium: 135 mmol/L (ref 135–145)

## 2018-10-14 LAB — GLUCOSE, POCT (MANUAL RESULT ENTRY): POC Glucose: 282 mg/dl — AB (ref 70–99)

## 2018-10-14 LAB — GLUCOSE, CAPILLARY
Glucose-Capillary: 230 mg/dL — ABNORMAL HIGH (ref 70–99)
Glucose-Capillary: 282 mg/dL — ABNORMAL HIGH (ref 70–99)
Glucose-Capillary: 234 mg/dL — ABNORMAL HIGH (ref 70–99)
Glucose-Capillary: 160 mg/dL — ABNORMAL HIGH (ref 70–99)

## 2018-10-14 LAB — CBC
HCT: 33.4 % — ABNORMAL LOW (ref 36.0–46.0)
Hemoglobin: 10.3 g/dL — ABNORMAL LOW (ref 12.0–15.0)
MCH: 26.3 pg (ref 26.0–34.0)
MCHC: 30.8 g/dL (ref 30.0–36.0)
MCV: 85.4 fL (ref 80.0–100.0)
Platelets: 324 10*3/uL (ref 150–400)
RBC: 3.91 MIL/uL (ref 3.87–5.11)
RDW: 14.7 % (ref 11.5–15.5)
WBC: 8.2 10*3/uL (ref 4.0–10.5)
nRBC: 0 % (ref 0.0–0.2)

## 2018-10-14 MED ORDER — SODIUM CHLORIDE 0.9% FLUSH
3.0000 mL | Freq: Two times a day (BID) | INTRAVENOUS | Status: DC
  Administered 2018-10-14 – 2018-10-15 (×3): 3 mL via INTRAVENOUS

## 2018-10-14 NOTE — Progress Notes (Signed)
Lip edema minimal now with ulcerations healing readily with oral care provided. Tolerating gtube feedings with gtube site care performed. Low grade fever T 99.2 this a.m. Urine less cloudy with less odor. Generalized edema. Left wrist and left fingers with 1+. Awaiting bed placement in new facility. Keflex via gtube given.

## 2018-10-14 NOTE — Clinical Social Work Note (Signed)
CSW spoke to [facility] admissions worker at [facility], [phone] and he said they are not able to accept patient until they have received insurance authorization and the pharmacy is closed today after 12pm, so they will not be able to get meds today either.  SNF is still waiting for insurance auth., CSW will be notified if facility receives auth before 12pm.  CSW continuing to follow patient's progress.  Ervin Knack. Tiffanie Blassingame, MSW, Theresia Majors [phone]  10/14/2018 11:45 AM

## 2018-10-14 NOTE — Care Management Important Message (Signed)
Copy of signed Medicare IM left with patient in room. 

## 2018-10-14 NOTE — Progress Notes (Signed)
[place] at [place] NAME: Summer Welch    MR#:  [id number]  DATE OF BIRTH:  26-Sep-1961  SUBJECTIVE:   No acute events overnight.  Awaiting SNF placement.      REVIEW OF SYSTEMS:    Review of Systems  Unable to perform ROS: Mental acuity    Nutrition: Tube feedings Tolerating Diet: yes Tolerating PT: bedbound at baseline   DRUG ALLERGIES:  No Known Allergies  VITALS:  Blood pressure (!) 145/82, pulse (!) 104, temperature 97.6 F (36.4 C), temperature source Oral, resp. rate 18, height '5\' 9"'$  (1.753 m), weight 86.6 kg, SpO2 97 %.  PHYSICAL EXAMINATION:   Physical Exam  GENERAL:  58 y.o.-year-old patient lying in bed in no acute distress.  EYES: Pupils equal, round, reactive to light and accommodation. No scleral icterus. Extraocular muscles intact.  HEENT: Head atraumatic, normocephalic. Oropharynx and nasopharynx clear.  Lower lip lesion as shown below   NECK:  Supple, no jugular venous distention. No thyroid enlargement, no tenderness LUNGS: Normal breath sounds bilaterally, no wheezing, rales, rhonchi. No use of accessory muscles of respiration.  CARDIOVASCULAR: S1, S2 normal. No murmurs, rubs, or gallops.  ABDOMEN: Soft, nontender, nondistended. Bowel sounds present. No organomegaly or mass.  EXTREMITIES: No cyanosis, clubbing or edema b/l.    NEUROLOGIC: Cranial nerves II through XII are intact. No focal Motor or sensory deficits b/l. Globally weak and aphasic.    PSYCHIATRIC: The patient is alert and oriented x 1.  SKIN: No obvious rash, lesion, or ulcer.    LABORATORY PANEL:   CBC Recent Labs  Lab 10/14/18 0325  WBC 8.2  HGB 10.3*  HCT 33.4*  PLT 324   ------------------------------------------------------------------------------------------------------------------  Chemistries  Recent Labs  Lab 10/10/18 2339  10/14/18 0325  NA 138   < > 135  K 3.8   < > 3.5  CL 99   < > 102  CO2 27   < > 25    GLUCOSE 267*   < > 222*  BUN 27*   < > 18  CREATININE 1.07*   < > 0.78  CALCIUM 9.5   < > 8.6*  AST 25  --   --   ALT 23  --   --   ALKPHOS 57  --   --   BILITOT 0.9  --   --    < > = values in this interval not displayed.   ------------------------------------------------------------------------------------------------------------------  Cardiac Enzymes Recent Labs  Lab 10/10/18 2339  TROPONINI 0.04*   ------------------------------------------------------------------------------------------------------------------  RADIOLOGY:  No results found.   ASSESSMENT AND PLAN:   58 year old female with past medical history of previous CVA, anxiety, aphasia secondary to recent CVA, essential hypertension, diabetes, depression who presents to the hospital due to swelling of her left side of her lower lip.  1.  Suspected sepsis- patient met criteria on admission given her tachycardia, tachypnea and urinalysis consistent with a UTI. - Urine cultures are positive for Proteus and it is fairly sensitive.  DC IV ceftriaxone and switched over to oral Keflex suspension.  Patient is now afebrile and hemodynamically stable.  2.  Urinary tract infection- cultures are positive for Proteus which is fairly sensitive - cont. Oral keflex suspension for additional 2 days.   3.  Lower lip swelling- unlikely this is angioedema as patient swelling is localized to the left lateral lip.  Did get a ENT consult who do not think this is angioedema but  likely traumatic ulcer with some swelling.  Continue local care as per ENT. - cont. Orajel for pain.   4.  Diabetes type 2 without complication- appreciate DM coordinator input.   - Continue Lantus, SSI, Novolog with Glucerna shakes. BS improved since yesterday.   5.  History of atrial fibrillation- rate controlled.  Continue Cardizem. -Continue Eliquis.  6.  History of previous CVA with resulting aphasia- continue Eliquis.  7.  Essential  hypertension-continue Cardizem, lisinopril.   8.  Hyperlipidemia- continue atorvastatin.  9.  Nutrition- cont. Tube feeds and pt. Is tolerating them with no residuals.   10.  Depression-continue Prozac.  Awaiting insurance approval to discharge to a skilled nursing facility.   All the records are reviewed and case discussed with Care Management/Social Worker. Management plans discussed with the patient, family and they are in agreement.  CODE STATUS: Full code  DVT Prophylaxis: Eliquis  TOTAL TIME TAKING CARE OF THIS PATIENT: 30 minutes.   POSSIBLE D/C IN 1-2 DAYS, DEPENDING ON CLINICAL CONDITION.   Henreitta Leber M.D on 10/14/2018 at 2:46 PM  Between 7am to 6pm - Pager - [phone]  After 6pm go to [email or web address] - [employer] [facility]  Office  [phone]  CC: Primary care physician; Center, [facility]

## 2018-10-15 LAB — GLUCOSE, CAPILLARY
Glucose-Capillary: 204 mg/dL — ABNORMAL HIGH (ref 70–99)
Glucose-Capillary: 206 mg/dL — ABNORMAL HIGH (ref 70–99)
Glucose-Capillary: 189 mg/dL — ABNORMAL HIGH (ref 70–99)
Glucose-Capillary: 180 mg/dL — ABNORMAL HIGH (ref 70–99)

## 2018-10-15 NOTE — Clinical Social Work Note (Signed)
CSW spoke with Ines Bloomer, admissions coordinator at [facility]. Shawn states that patient/POA would have to agree to sign over disability check to facility in order for patient to come there. CSW explained this to patient's POA [facility]. POA states that she is not sure she wants to do this because that is her only income. CSW explained that patient could not use [facility] insurance because patient is not appropriate for rehab and would therefore have to use medicaid which would require the disability check. CSW also explained that patient would have to be in the facility for a minimum of 30 days using medicaid. POA now states that she is unsure if she wants to do this and needs to think about it and discuss with family. POA states she will call CSW with a decision today. CSW will continue to follow.   Ruthe Mannan MSW, [address] [phone]

## 2018-10-15 NOTE — Progress Notes (Signed)
[place] at [place] NAME: Summer Welch    MR#:  [id number]  DATE OF BIRTH:  Jul 22, 1961  SUBJECTIVE:   No acute events overnight.  Family at bedside.  Awaiting placement to SnF/STR.   REVIEW OF SYSTEMS:    Review of Systems  Unable to perform ROS: Mental acuity    Nutrition: Tube feedings Tolerating Diet: yes Tolerating PT: bedbound at baseline   DRUG ALLERGIES:  No Known Allergies  VITALS:  Blood pressure 131/74, pulse 82, temperature 98.7 F (37.1 C), temperature source Oral, resp. rate 20, height '5\' 9"'$  (1.753 m), weight 86.3 kg, SpO2 96 %.  PHYSICAL EXAMINATION:   Physical Exam  GENERAL:  58 y.o.-year-old patient lying in bed in no acute distress.  EYES: Pupils equal, round, reactive to light and accommodation. No scleral icterus. Extraocular muscles intact.  HEENT: Head atraumatic, normocephalic. Oropharynx and nasopharynx clear.  Lower lip lesion as shown below, Significant oral secretions.    NECK:  Supple, no jugular venous distention. No thyroid enlargement, no tenderness LUNGS: Normal breath sounds bilaterally, no wheezing, rales, rhonchi. No use of accessory muscles of respiration.  CARDIOVASCULAR: S1, S2 normal. No murmurs, rubs, or gallops.  ABDOMEN: Soft, nontender, nondistended. Bowel sounds present. No organomegaly or mass. + PEG tube in place.  EXTREMITIES: No cyanosis, clubbing or edema b/l.    NEUROLOGIC: Cranial nerves II through XII are intact. No focal Motor or sensory deficits b/l. Globally weak and aphasic.    PSYCHIATRIC: The patient is alert and oriented x 1.  SKIN: No obvious rash, lesion, or ulcer.    LABORATORY PANEL:   CBC Recent Labs  Lab 10/14/18 0325  WBC 8.2  HGB 10.3*  HCT 33.4*  PLT 324   ------------------------------------------------------------------------------------------------------------------  Chemistries  Recent Labs  Lab 10/10/18 2339  10/14/18 0325  NA 138    < > 135  K 3.8   < > 3.5  CL 99   < > 102  CO2 27   < > 25  GLUCOSE 267*   < > 222*  BUN 27*   < > 18  CREATININE 1.07*   < > 0.78  CALCIUM 9.5   < > 8.6*  AST 25  --   --   ALT 23  --   --   ALKPHOS 57  --   --   BILITOT 0.9  --   --    < > = values in this interval not displayed.   ------------------------------------------------------------------------------------------------------------------  Cardiac Enzymes Recent Labs  Lab 10/10/18 2339  TROPONINI 0.04*   ------------------------------------------------------------------------------------------------------------------  RADIOLOGY:  No results found.   ASSESSMENT AND PLAN:   58 year old female with past medical history of previous CVA, anxiety, aphasia secondary to recent CVA, essential hypertension, diabetes, depression who presents to the hospital due to swelling of her left side of her lower lip.  1.  Suspected sepsis- patient met criteria on admission given her tachycardia, tachypnea and urinalysis consistent with a UTI. - Urine cultures are positive for Proteus and it is fairly sensitive.   - cont. Keflex and finish course.  Afebrile, hemodynamically stable.   2.  Urinary tract infection- cultures are positive for Proteus which is fairly sensitive - cont. Oral keflex suspension X 1 more day.   3.  Lower lip swelling- unlikely this is angioedema as patient swelling is localized to the left lateral lip.  Did get a ENT consult who do not think this is  angioedema but likely traumatic ulcer with some swelling.  Continue local care as per ENT. Much improved. - cont. Orajel for pain.   4.  Diabetes type 2 without complication- appreciate DM coordinator input.   - Continue Lantus, SSI, Novolog with Glucerna shakes. BS improved since yesterday.   5.  History of atrial fibrillation- rate controlled.  Continue Cardizem. -Continue Eliquis.  6.  History of previous CVA with resulting aphasia- continue Eliquis.  7.   Essential hypertension-continue Cardizem, lisinopril.   8.  Hyperlipidemia- continue atorvastatin.  9.  Nutrition- cont. Tube feeds and pt. Is tolerating them with no residuals.   10.  Depression-continue Prozac.  Patient was going to be discharged to short-term rehab to another facility.  After further discussion with social work patient would need to give up her disability check to pay for short-term rehab.  Family is reconsidering this and possibly thinking about taking the patient home with home health services.  Await further input from social work.   All the records are reviewed and case discussed with Care Management/Social Worker. Management plans discussed with the patient, family and they are in agreement.  CODE STATUS: Full code  DVT Prophylaxis: Eliquis  TOTAL TIME TAKING CARE OF THIS PATIENT: 30 minutes.   POSSIBLE D/C IN 1-2 DAYS, DEPENDING ON CLINICAL CONDITION.   Henreitta Leber M.D on 10/15/2018 at 1:20 PM  Between 7am to 6pm - Pager - [phone]  After 6pm go to [email or web address] - [employer] [facility]  Office  [phone]  CC: Primary care physician; Center, [facility]

## 2018-10-15 NOTE — Progress Notes (Signed)
Nutrition Follow-up  DOCUMENTATION CODES:   Not applicable  INTERVENTION:  Continue Glucerna 1.5 Cal bolus 1 can QID per G-tube. Also provide Pro-Stat 30 mL once daily per G-tube. Provides 1524 kcal, 93 grams of protein, 15.2 grams of fiber, 720 mL H2O daily.  Flush tube with 60 mL before each can of tube feeding and 120 mL after each can of tube feeding. Flush tube with 60 mL prior to providing Pro-Stat, mix Pro-Stat with 60 mL of water before giving per tube, and flush with another 60 mL water after Pro-Stat.  Continue MVI daily per tube, vitamin B12 1000 micrograms daily per tube, and vitamin C 250 mg BID per tube.  NUTRITION DIAGNOSIS:   Inadequate oral intake related to inability to eat, dysphagia as evidenced by NPO status(reliance on G-tube to meet 100% calorie/protein/hydration needs).  Ongoing - addressing with TF regimen.  GOAL:   Patient will meet greater than or equal to 90% of their needs  Met with TF regimen.  MONITOR:   Labs, Weight trends, TF tolerance, Skin, I & O's  REASON FOR ASSESSMENT:   Malnutrition Screening Tool, Consult Assessment of nutrition requirement/status, Enteral/tube feeding initiation and management  ASSESSMENT:   58 year old female with PMHx of anxiety, hx CVA, DM type 2 who is admitted from [facility] with angioedema, sepsis, UTI.  Met with patient at bedside. She nodded that she is tolerating tube feeds. Denies any nausea or abdominal pain. Also discussed with RN and patient is doing well with tube feed regimen. Pending placement at SNF vs STR.  Enteral Access: 18 Fr. G-tube  Medications reviewed and include: Eliquis, Colace 100 mg BID, ferrous sulfate 300 mg daily, Novolog 0-9 units TID, Novolog 0-5 units QHS, Novolog 4 units QID with Glucerna bolus, Lantus 24 units QHS, melatonin 5 mg QHS, MVI daily, Floraster 250 mg daily, vitamin B12 1000 micrograms daily, vitamin C 250 mg BID.  Labs reviewed: CBG 160-282.  Diet Order:    Diet Order    None      EDUCATION NEEDS:   No education needs have been identified at this time  Skin:  Skin Assessment: Reviewed RN Assessment(scattered ecchymosis)  Last BM:  10/11/2018 per chart  Height:   Ht Readings from Last 1 Encounters:  10/10/18 _0  (1.753 m)    Weight:   Wt Readings from Last 1 Encounters:  10/15/18 86.3 kg    Ideal Body Weight:  65.9 kg  BMI:  Body mass index is 28.1 kg/m.  Estimated Nutritional Needs:   Kcal:  2637-8588 (MSJ x 1.1-1.3)  Protein:  80-100 grams (1-1.2 grams/kg)  Fluid:  1.6-1.9 L/day (25-30 mL/kg IBW)  Willey Blade, MS, RD, LDN Office: [phone] Pager: [phone] After Hours/Weekend Pager: [phone]

## 2018-10-16 ENCOUNTER — Encounter: Payer: Self-pay | Admitting: Specialist

## 2018-10-16 LAB — GLUCOSE, CAPILLARY
Glucose-Capillary: 194 mg/dL — ABNORMAL HIGH (ref 70–99)
Glucose-Capillary: 232 mg/dL — ABNORMAL HIGH (ref 70–99)

## 2018-10-16 MED ORDER — FREE WATER: 180.0000 mL | Freq: Three times a day (TID) | Status: AC

## 2018-10-16 MED ORDER — PRO-STAT SUGAR FREE PO LIQD: 30.0000 mL | Freq: Every day | ORAL | 0 refills | Status: AC

## 2018-10-16 MED ORDER — DOCUSATE SODIUM 100 MG PO CAPS: 100.0000 mg | ORAL_CAPSULE | Freq: Two times a day (BID) | ORAL | 0 refills | Status: AC

## 2018-10-16 MED ORDER — INSULIN GLARGINE 100 UNIT/ML ~~LOC~~ SOLN: 24.0000 [IU] | Freq: Every day | SUBCUTANEOUS | 11 refills | Status: AC

## 2018-10-16 MED ORDER — ATROPINE SULFATE 1 % OP SOLN: 1.0000 [drp] | Freq: Three times a day (TID) | OPHTHALMIC | 12 refills | Status: AC | PRN

## 2018-10-16 MED ORDER — BENZOCAINE 10 % MT GEL: 1.0000 "application " | Freq: Three times a day (TID) | OROMUCOSAL | 0 refills | Status: AC | PRN

## 2018-10-16 MED ORDER — INSULIN ASPART 100 UNIT/ML ~~LOC~~ SOLN: 4.0000 [IU] | Freq: Four times a day (QID) | SUBCUTANEOUS | 11 refills | Status: AC

## 2018-10-16 MED ORDER — GLUCERNA 1.5 CAL PO LIQD: 237.0000 mL | Freq: Three times a day (TID) | ORAL | Status: AC

## 2018-10-16 MED ORDER — GLYCOPYRROLATE 1 MG PO TABS: 2.0000 mg | ORAL_TABLET | Freq: Three times a day (TID) | ORAL | Status: AC | PRN

## 2018-10-16 NOTE — Clinical Social Work Note (Signed)
Patient is medically ready for discharge today. CSW notified patient's POA Einar Pheasant [phone] of discharge today. CSW also notified Shawn, admissions coordinator at [facility] of discharge today. Patient will be transported by EMS. RN to call report and call for transport.   Ruthe Mannan MSW, [address] [phone]

## 2018-10-16 NOTE — Discharge Summary (Signed)
[facility] [place] NAME: Summer Welch    MR#:  [id number]  DATE OF BIRTH:  [birth year]  DATE OF ADMISSION:  10/10/2018 ADMITTING PHYSICIAN: Harrie Foreman, MD  DATE OF DISCHARGE: 10/16/2018  PRIMARY CARE PHYSICIAN: [facility], [facility]    ADMISSION DIAGNOSIS:  Elevated troponin [R79.89] Angioedema, initial encounter [T78.3XXA] Urinary tract infection without hematuria, site unspecified [N39.0] Sepsis, due to unspecified organism, unspecified whether acute organ dysfunction present ([place]) [A41.9]  DISCHARGE DIAGNOSIS:  Active Problems:   Sepsis ([place])   SECONDARY DIAGNOSIS:   Past Medical History:  Diagnosis Date  . Anxiety   . Atrial fibrillation ([place])   . Diabetes mellitus ([place])   . Essential hypertension   . 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  1.  Suspected sepsis- patient met criteria on admission given her tachycardia, tachypnea and urinalysis consistent with a UTI. -Initially patient was treated with IV ceftriaxone, but urine cultures came back positive for Proteus which was fairly pansensitive.  Patient switched over to oral Keflex and has finished treatment for her UTI. - afebrile, hemodynamically stable.   2.  Urinary tract infection- cultures were positive for Proteus which was fairly sensitive -Initially treated with IV ceftriaxone, and then switched over to oral Keflex and patient has finished treatment for the UTI now.   3.  Lower lip swelling- this was thought to be secondary to angioedema due to ACE inhibitors.  Although that was unlikely.  Patient was seen by ENT and they also did not think this was angioedema.  This was more atraumatic ulcer with localized swelling.   Patient was treated supportive care with Orajel and also given atropine drops for secretions.  Patient has clinically improved. -She will continue the Orajel, atropine drops and will also discharge her on some glycopyrrolate to help with the secretions.    4.  Diabetes type 2 without complication- patient was seen by diabetes coordinator.  Patient's insulin was adjusted. -Presently patient will be discharged on Lantus, her sliding scale coverage.  She will also get NovoLog 4 units 3 times daily with her Glucerna shakes.  Further changes to her diabetic regimen can be done as an outpatient.  5.  History of atrial fibrillation- remained rate controlled while in the hospital.  Patient will continue her Cardizem. -Patient will also continue her Eliquis.  6.  History of previous CVA with resulting aphasia- she will continue Eliquis.  7.  Essential hypertension- she will continue Cardizem, lisinopril.   8.  Hyperlipidemia- she will continue atorvastatin.  9.  Nutrition- seenSTab by dietary while in the hospital.  Patient will continue her Glucerna shakes 3 times daily and at bedtime.  She will continue her pro-stat daily.  Continue free water flushes as stated below.  10.  Depression- she will continue Prozac.  DISCHARGE CONDITIONS:   Stable  CONSULTS OBTAINED:  Treatment Team:  Sherri Sear, MD  DRUG ALLERGIES:  No Known Allergies  DISCHARGE MEDICATIONS:   Allergies as of 10/16/2018   No Known Allergies     Medication List    STOP taking these medications   ANBESOL MAXIMUM STRENGTH 20 % Liqd Generic drug:  BENZOCAINE (DENTAL) Replaced by:  benzocaine 10 % mucosal gel  TAKE these medications   acetaminophen 160 MG/5ML solution Commonly known as:  TYLENOL Place 650 mg into feeding tube every 4 (four) hours as needed (pain).   aspirin 81 MG chewable tablet Place 81 mg into feeding tube daily.   atorvastatin 80 MG tablet Commonly known as:  LIPITOR Place 80  mg into feeding tube daily at 6 PM.   atropine 1 % ophthalmic solution Place 1 drop under the tongue 3 (three) times daily as needed (secretions). What changed:  how to take this   benzocaine 10 % mucosal gel Commonly known as:  ORAJEL Use as directed 1 application in the mouth or throat 3 (three) times daily as needed (pain). Replaces:  ANBESOL MAXIMUM STRENGTH 20 % Liqd   diltiazem 60 MG tablet Commonly known as:  CARDIZEM Place 60 mg into feeding tube 3 (three) times daily.   docusate sodium 100 MG capsule Commonly known as:  COLACE Take 1 capsule (100 mg total) by mouth 2 (two) times daily.   ELIQUIS 5 MG Tabs tablet Generic drug:  apixaban Place 5 mg into feeding tube 2 (two) times daily.   feeding supplement (GLUCERNA 1.5 CAL) Liqd Place 237 mLs into feeding tube 4 (four) times daily -  with meals and at bedtime.   feeding supplement (PRO-STAT SUGAR FREE 64) Liqd Place 30 mLs into feeding tube daily. Start taking on:  October 17, 2018   ferrous sulfate 300 (60 Fe) MG/5ML syrup Place 300 mg into feeding tube daily.   FLUoxetine 20 MG/5ML solution Commonly known as:  PROZAC Place 40 mg into feeding tube daily.   fluticasone 50 MCG/ACT nasal spray Commonly known as:  FLONASE Place 2 sprays into both nostrils every 12 (twelve) hours.   free water Soln Place 180 mLs into feeding tube 4 (four) times daily -  with meals and at bedtime.   glucagon 1 MG Solr injection Commonly known as:  GLUCAGEN Inject 1 mg into the muscle daily as needed for low blood sugar.   glycopyrrolate 1 MG tablet Commonly known as:  ROBINUL Take 2 tablets (2 mg total) by mouth 3 (three) times daily as needed (Secretions.).   hydrALAZINE 25 MG tablet Commonly known as:  APRESOLINE Place 25 mg into feeding tube every 6 (six) hours as needed (SBP >165 or DBP >95).   insulin aspart 100 UNIT/ML injection Commonly known as:  novoLOG Inject 4 Units into the skin 4 (four) times daily.    insulin glargine 100 UNIT/ML injection Commonly known as:  LANTUS Inject 0.24 mLs (24 Units total) into the skin at bedtime. What changed:    how much to take  when to take this   insulin lispro 100 UNIT/ML injection Commonly known as:  HUMALOG Inject 0-10 Units into the skin every 6 (six) hours. If blood sugar is less than 60, call MD 201-250= 2 units 251-300= 4 units 301-350= 6 units 351-400= 8 units 401-450= 10 units If blood sugar is greater than 450, call MD. What changed:  Another medication with the same name was removed. Continue taking this medication, and follow the directions you see here.   lisinopril 20 MG tablet Commonly known as:  PRINIVIL,ZESTRIL Place 20 mg into feeding tube daily.   Melatonin 5 MG Tabs Place 5 mg into feeding tube at bedtime.   multivitamin with minerals Tabs tablet Place 1 tablet into feeding tube daily.   ondansetron 4 MG tablet Commonly known as:  ZOFRAN Place 4 mg into feeding tube 4 (four)  times daily as needed for nausea or vomiting.   polyethylene glycol packet Commonly known as:  MIRALAX / GLYCOLAX Place 17 g into feeding tube daily as needed for mild constipation.   saccharomyces boulardii 250 MG capsule Commonly known as:  FLORASTOR Place 250 mg into feeding tube daily.   vitamin B-12 1000 MCG tablet Commonly known as:  CYANOCOBALAMIN Place 1,000 mcg into feeding tube daily.   vitamin C 250 MG tablet Commonly known as:  ASCORBIC ACID Place 250 mg into feeding tube 2 (two) times daily.         DISCHARGE INSTRUCTIONS:   DIET:  Tube feeds as mentioned above.   DISCHARGE CONDITION:  Stable  ACTIVITY:  Activity as tolerated  OXYGEN:  Home Oxygen: No.   Oxygen Delivery: room air  DISCHARGE LOCATION:  nursing home   If you experience worsening of your admission symptoms, develop shortness of breath, life threatening emergency, suicidal or homicidal thoughts you must seek medical attention immediately by  calling 911 or calling your MD immediately  if symptoms less severe.  You Must read complete instructions/literature along with all the possible adverse reactions/side effects for all the Medicines you take and that have been prescribed to you. Take any new Medicines after you have completely understood and accpet all the possible adverse reactions/side effects.   Please note  You were cared for by a hospitalist during your hospital stay. If you have any questions about your discharge medications or the care you received while you were in the hospital after you are discharged, you can call the unit and asked to speak with the hospitalist on call if the hospitalist that took care of you is not available. Once you are discharged, your primary care physician will handle any further medical issues. Please note that NO REFILLS for any discharge medications will be authorized once you are discharged, as it is imperative that you return to your primary care physician (or establish a relationship with a primary care physician if you do not have one) for your aftercare needs so that they can reassess your need for medications and monitor your lab values.     Today   No acute events overnight, still has some oral secretions but improving.  Lower lip swelling much improved.  Tolerating her tube feedings, hemodynamically stable.  Will discharge to short-term rehab today.  VITAL SIGNS:  Blood pressure (!) 161/93, pulse 89, temperature 98.7 F (37.1 C), temperature source Axillary, resp. rate 18, height '5\' 9"'$  (1.753 m), weight 84.8 kg, SpO2 98 %.  I/O:    Intake/Output Summary (Last 24 hours) at 10/16/2018 1315 Last data filed at 10/16/2018 0702 Gross per 24 hour  Intake 60 ml  Output 1050 ml  Net -990 ml    PHYSICAL EXAMINATION:   GENERAL:  58 y.o.-year-old patient lying in bed in no acute distress.  EYES: Pupils equal, round, reactive to light and accommodation. No scleral icterus. Extraocular  muscles intact.  HEENT: Head atraumatic, normocephalic. Oropharynx and nasopharynx clear.  Lower lip swelling much improved, traumatic ulcers with mild exudate but improved.  Clear oral secretions.   NECK:  Supple, no jugular venous distention. No thyroid enlargement, no tenderness LUNGS: Normal breath sounds bilaterally, no wheezing, rales, rhonchi. No use of accessory muscles of respiration.  CARDIOVASCULAR: S1, S2 normal. No murmurs, rubs, or gallops.  ABDOMEN: Soft, nontender, nondistended. Bowel sounds present. No organomegaly or mass. + PEG tube in place.  EXTREMITIES: No cyanosis, clubbing or edema b/l.  NEUROLOGIC: Cranial nerves II through XII are intact. No focal Motor or sensory deficits b/l. Globally weak and aphasic.    PSYCHIATRIC: The patient is alert and oriented x 1.  SKIN: No obvious rash, lesion, or ulcer.   DATA REVIEW:   CBC Recent Labs  Lab 10/14/18 0325  WBC 8.2  HGB 10.3*  HCT 33.4*  PLT 324    Chemistries  Recent Labs  Lab 10/10/18 2339  10/14/18 0325  NA 138   < > 135  K 3.8   < > 3.5  CL 99   < > 102  CO2 27   < > 25  GLUCOSE 267*   < > 222*  BUN 27*   < > 18  CREATININE 1.07*   < > 0.78  CALCIUM 9.5   < > 8.6*  AST 25  --   --   ALT 23  --   --   ALKPHOS 57  --   --   BILITOT 0.9  --   --    < > = values in this interval not displayed.    Cardiac Enzymes Recent Labs  Lab 10/10/18 2339  TROPONINI 0.04*    Microbiology Results  Results for orders placed or performed during the hospital encounter of 10/10/18  Urine culture     Status: Abnormal   Collection Time: 10/10/18 11:39 PM  Result Value Ref Range Status   Specimen Description   Final    URINE, RANDOM Performed at [facility], [address], [place], [place] [address]    Special Requests   Final    NONE Performed at [facility], [address] [place], [place] [address]    Culture >=100,000 COLONIES/mL PROTEUS MIRABILIS (A)  Final   Report Status  10/13/2018 FINAL  Final   Organism ID, Bacteria PROTEUS MIRABILIS (A)  Final      Susceptibility   Proteus mirabilis - MIC*    AMPICILLIN <=2 SENSITIVE Sensitive     CEFAZOLIN <=4 SENSITIVE Sensitive     CEFTRIAXONE <=1 SENSITIVE Sensitive     CIPROFLOXACIN <=0.25 SENSITIVE Sensitive     GENTAMICIN <=1 SENSITIVE Sensitive     IMIPENEM 4 SENSITIVE Sensitive     NITROFURANTOIN 128 RESISTANT Resistant     TRIMETH/SULFA <=20 SENSITIVE Sensitive     AMPICILLIN/SULBACTAM <=2 SENSITIVE Sensitive     PIP/TAZO <=4 SENSITIVE Sensitive     * >=100,000 COLONIES/mL PROTEUS MIRABILIS  Blood Culture (routine x 2)     Status: None (Preliminary result)   Collection Time: 10/11/18  2:12 AM  Result Value Ref Range Status   Specimen Description BLOOD RIGHT FOREARM  Final   Special Requests   Final    BOTTLES DRAWN AEROBIC AND ANAEROBIC Blood Culture results may not be optimal due to an inadequate volume of blood received in culture bottles   Culture   Final    NO GROWTH 4 DAYS Performed at [facility], [address] [place],  [address]    Report Status PENDING  Incomplete  Blood Culture (routine x 2)     Status: None (Preliminary result)   Collection Time: 10/11/18  2:12 AM  Result Value Ref Range Status   Specimen Description BLOOD RIGHT HAND  Final   Special Requests   Final    BOTTLES DRAWN AEROBIC AND ANAEROBIC Blood Culture results may not be optimal due to an inadequate volume of blood received in culture bottles   Culture   Final  NO GROWTH 4 DAYS Performed at [facility], [address] [place], [place] [address]    Report Status PENDING  Incomplete  MRSA PCR Screening     Status: None   Collection Time: 10/11/18  5:14 AM  Result Value Ref Range Status   MRSA by PCR NEGATIVE NEGATIVE Final    Comment:        The GeneXpert MRSA Assay (FDA approved for NASAL specimens only), is one component of a comprehensive MRSA colonization surveillance program.  It is not intended to diagnose MRSA infection nor to guide or monitor treatment for MRSA infections. Performed at [facility], [address], [place], [place] [address]     RADIOLOGY:  No results found.    Management plans discussed with the patient, family and they are in agreement.  CODE STATUS:     Code Status Orders  (From admission, onward)         Start     Ordered   10/11/18 0401  Full code  Continuous     10/11/18 0400        TOTAL TIME TAKING CARE OF THIS PATIENT: 45 minutes.    Henreitta Leber M.D on 10/16/2018 at 1:15 PM  Between 7am to 6pm - Pager - [phone]  After 6pm go to [email or web address] - [facility]  Office  [phone]  CC: Primary care physician; Center, [facility]

## 2018-10-16 NOTE — Care Management Important Message (Signed)
Copy of signed Medicare IM left with patient in room. 

## 2018-10-16 NOTE — Progress Notes (Signed)
Called rep report  to Archie Patten RN at [facility] in [place], [phone]

## 2018-10-16 NOTE — Progress Notes (Signed)
EMS called for transportation to [facility] in [place], [place]. Bo Mcclintock, RN

## 2018-10-17 LAB — CULTURE, BLOOD (ROUTINE X 2)
Culture: NO GROWTH
Culture: NO GROWTH

## 2018-10-23 MED ORDER — GENERIC EXTERNAL MEDICATION
1000.00 | Status: DC
Start: 2018-10-22 — End: 2018-10-23

## 2018-10-23 MED ORDER — LISINOPRIL 20 MG PO TABS
40.00 | ORAL_TABLET | ORAL | Status: DC
Start: 2018-10-22 — End: 2018-10-23

## 2018-10-23 MED ORDER — FLUOXETINE HCL 20 MG/5ML PO SOLN
40.00 | ORAL | Status: DC
Start: 2018-10-22 — End: 2018-10-23

## 2018-10-23 MED ORDER — POLYETHYLENE GLYCOL 3350 17 G PO PACK
17.00 | PACK | ORAL | Status: DC
Start: 2018-10-22 — End: 2018-10-23

## 2018-10-23 MED ORDER — INSULIN GLARGINE 100 UNIT/ML ~~LOC~~ SOLN
24.00 | SUBCUTANEOUS | Status: DC
Start: 2018-10-22 — End: 2018-10-23

## 2018-10-23 MED ORDER — GENERIC EXTERNAL MEDICATION
1.00 | Status: DC
Start: 2018-10-22 — End: 2018-10-23

## 2018-10-23 MED ORDER — ATORVASTATIN CALCIUM 40 MG PO TABS
80.00 | ORAL_TABLET | ORAL | Status: DC
Start: 2018-10-22 — End: 2018-10-23

## 2018-10-23 MED ORDER — ASPIRIN 81 MG PO CHEW
81.00 | CHEWABLE_TABLET | ORAL | Status: DC
Start: 2018-10-22 — End: 2018-10-23

## 2018-10-23 MED ORDER — GENERIC EXTERNAL MEDICATION
90.00 | Status: DC
Start: 2018-10-21 — End: 2018-10-23

## 2018-10-23 MED ORDER — FERROUS SULFATE 300 (60 FE) MG/5ML PO SYRP
300.00 | ORAL_SOLUTION | ORAL | Status: DC
Start: 2018-10-22 — End: 2018-10-23

## 2018-10-23 MED ORDER — PROMETHAZINE HCL 25 MG PO TABS
25.00 | ORAL_TABLET | ORAL | Status: DC
Start: ? — End: 2018-10-23

## 2018-10-23 MED ORDER — ALBUTEROL SULFATE 2.5 MG/0.5ML IN NEBU
2.50 | INHALATION_SOLUTION | RESPIRATORY_TRACT | Status: DC
Start: ? — End: 2018-10-23

## 2018-10-23 MED ORDER — ATROPINE SULFATE 1 % OP SOLN
1.00 | OPHTHALMIC | Status: DC
Start: 2018-10-21 — End: 2018-10-23

## 2018-10-23 MED ORDER — DEXTROSE 50 % IV SOLN
12.50 | INTRAVENOUS | Status: DC
Start: ? — End: 2018-10-23

## 2018-10-23 MED ORDER — ONDANSETRON HCL 4 MG/2ML IJ SOLN
4.00 | INTRAMUSCULAR | Status: DC
Start: ? — End: 2018-10-23

## 2018-10-23 MED ORDER — GLYCOPYRROLATE 1 MG PO TABS
2.00 | ORAL_TABLET | ORAL | Status: DC
Start: ? — End: 2018-10-23

## 2018-10-23 MED ORDER — GLUCAGON HCL RDNA (DIAGNOSTIC) 1 MG IJ SOLR
1.00 | INTRAMUSCULAR | Status: DC
Start: ? — End: 2018-10-23

## 2018-10-23 MED ORDER — INSULIN LISPRO 100 UNIT/ML ~~LOC~~ SOLN
0.00 | SUBCUTANEOUS | Status: DC
Start: 2018-10-21 — End: 2018-10-23

## 2018-10-23 MED ORDER — TRAZODONE HCL 50 MG PO TABS
25.00 | ORAL_TABLET | ORAL | Status: DC
Start: 2018-10-21 — End: 2018-10-23

## 2018-10-23 MED ORDER — APIXABAN 5 MG PO TABS
5.00 | ORAL_TABLET | ORAL | Status: DC
Start: 2018-10-21 — End: 2018-10-23

## 2018-10-23 MED ORDER — LIDOCAINE HCL (PF) 1 % IJ SOLN
.50 | INTRAMUSCULAR | Status: DC
Start: ? — End: 2018-10-23

## 2018-10-23 MED ORDER — SENNOSIDES-DOCUSATE SODIUM 8.6-50 MG PO TABS
2.00 | ORAL_TABLET | ORAL | Status: DC
Start: ? — End: 2018-10-23

## 2018-10-23 MED ORDER — MELATONIN 3 MG PO TABS
6.00 | ORAL_TABLET | ORAL | Status: DC
Start: 2018-10-21 — End: 2018-10-23

## 2018-10-23 MED ORDER — ACETAMINOPHEN 325 MG PO TABS
650.00 | ORAL_TABLET | ORAL | Status: DC
Start: ? — End: 2018-10-23

## 2018-10-23 MED ORDER — DEXTROSE 10 % IV SOLN
50.00 | INTRAVENOUS | Status: DC
Start: ? — End: 2018-10-23

## 2018-10-30 ENCOUNTER — Ambulatory Visit: Payer: Self-pay

## 2019-06-30 IMAGING — DX DG HAND COMPLETE 3+V*L*
3 series · 3 of 3 positions shown · non-contrast
Comparison: None.

CLINICAL DATA: Left hand swelling

EXAM:
LEFT HAND - COMPLETE 3+ VIEW

[hand ap]
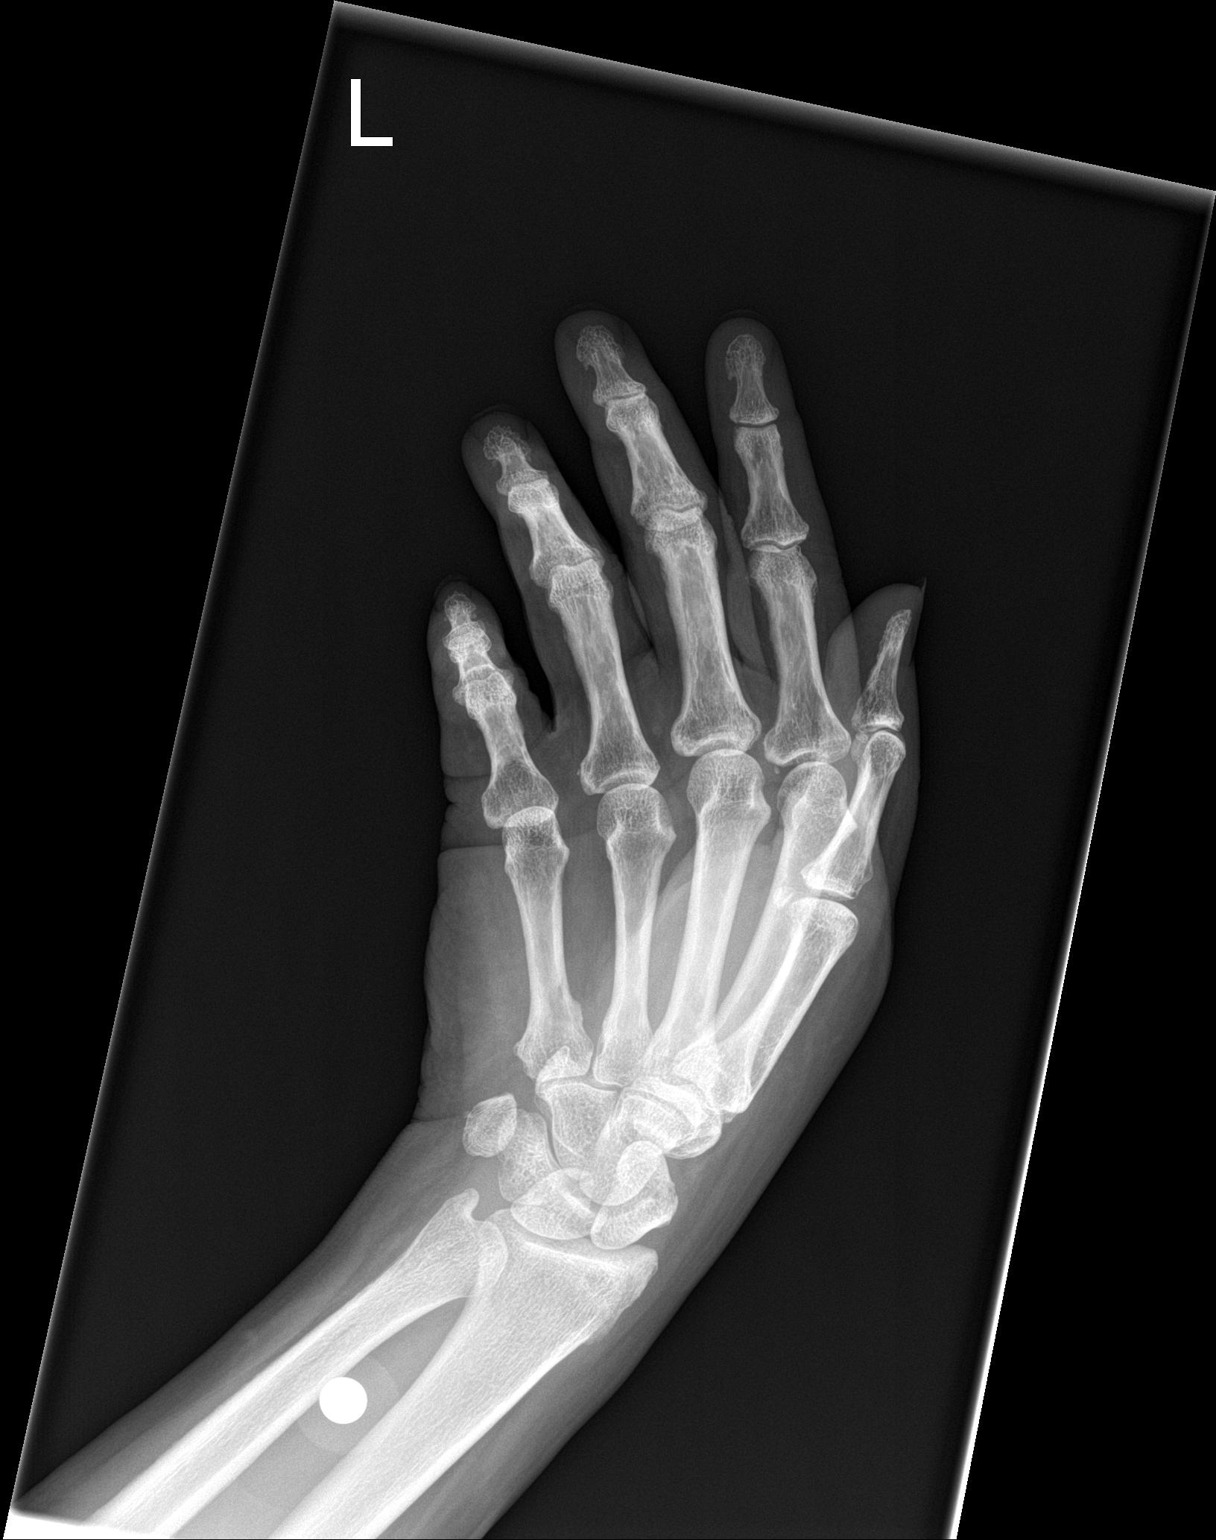

[hand obl]
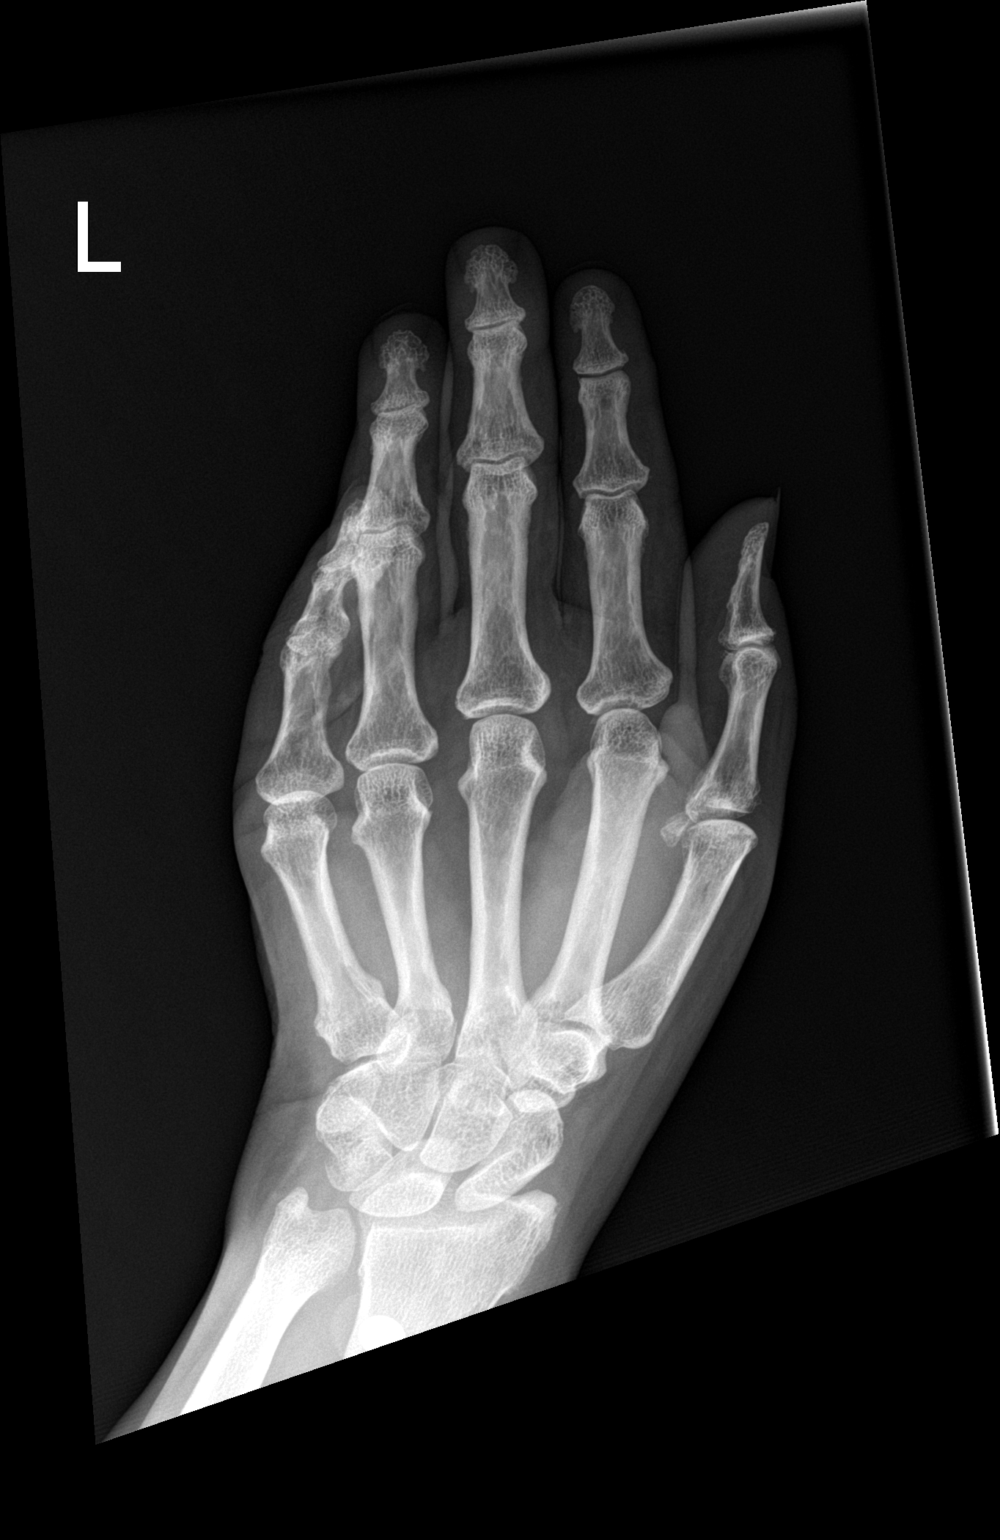

[hand lat]
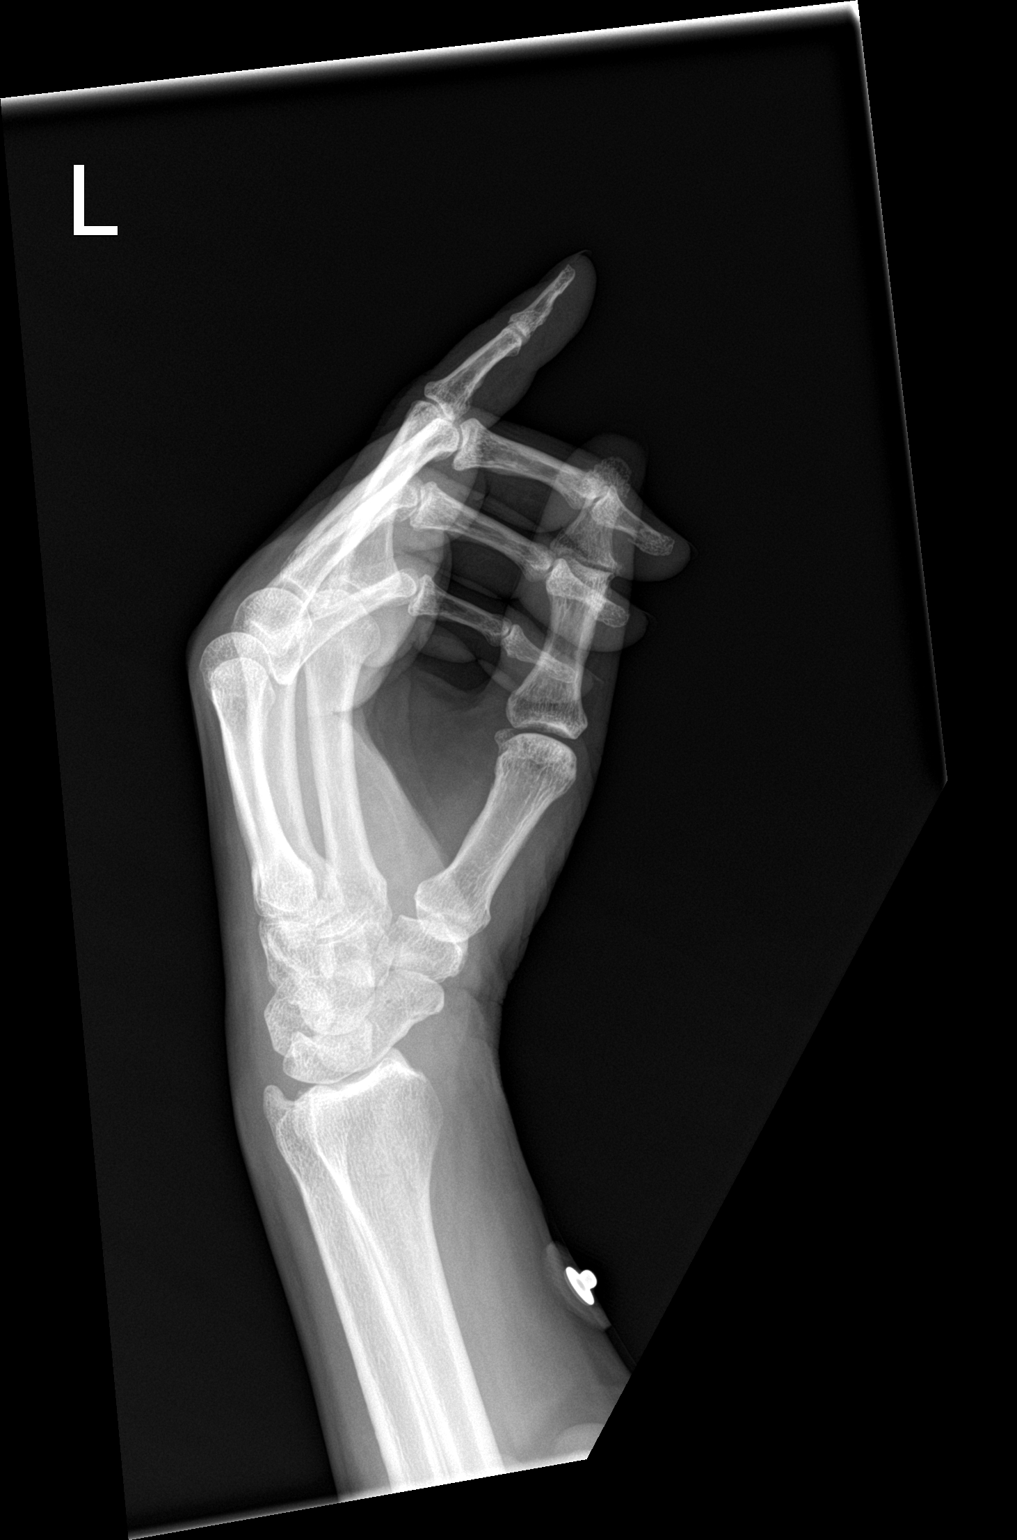

[3 of 3 positions shown; findings below may reference images not displayed]

FINDINGS: There is no evidence of fracture or dislocation. There is no
evidence of arthropathy or other focal bone abnormality. Soft
tissues are unremarkable.
IMPRESSION: No acute osseous abnormality of the left hand.
